# Patient Record
Sex: Male | Born: 1990 | Race: Black or African American | Hispanic: No | Marital: Married | Smoking: Current every day smoker
Health system: Southern US, Community
[De-identification: ages and names within clinical notes are randomized; demographics above are authoritative.]

## PROBLEM LIST (undated history)

## (undated) HISTORY — PX: OTHER SURGICAL HISTORY: SHX169

---
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
## 2012-01-21 ENCOUNTER — Encounter (HOSPITAL_COMMUNITY): Payer: Self-pay | Admitting: *Deleted

## 2012-01-21 ENCOUNTER — Emergency Department (HOSPITAL_COMMUNITY)
Admission: EM | Admit: 2012-01-21 | Discharge: 2012-01-21 | Disposition: A | Discharge status: Home / Self Care | Payer: BC Managed Care – PPO | Attending: Emergency Medicine | Admitting: Emergency Medicine

## 2012-01-21 DIAGNOSIS — S41009A Unspecified open wound of unspecified shoulder, initial encounter: Secondary | ICD-10-CM | POA: Insufficient documentation

## 2012-01-21 DIAGNOSIS — IMO0002 Reserved for concepts with insufficient information to code with codable children: Secondary | ICD-10-CM

## 2012-01-21 NOTE — ED Notes (Signed)
MD at bedside. 

## 2012-01-21 NOTE — ED Notes (Signed)
Patient denies pain and is resting comfortably.  
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
## 2012-04-12 ENCOUNTER — Encounter (HOSPITAL_COMMUNITY): Payer: Self-pay | Admitting: Emergency Medicine

## 2012-04-12 ENCOUNTER — Emergency Department (HOSPITAL_COMMUNITY)
Admission: EM | Admit: 2012-04-12 | Discharge: 2012-04-12 | Discharge status: Against Medical Advice | Payer: BC Managed Care – PPO | Attending: Emergency Medicine | Admitting: Emergency Medicine

## 2012-04-12 DIAGNOSIS — Z4802 Encounter for removal of sutures: Secondary | ICD-10-CM | POA: Insufficient documentation

## 2012-04-12 DIAGNOSIS — F17200 Nicotine dependence, unspecified, uncomplicated: Secondary | ICD-10-CM | POA: Insufficient documentation

## 2012-04-12 NOTE — ED Notes (Signed)
Called to room x 3, no answer 

## 2012-04-12 NOTE — ED Notes (Signed)
Pt here to have staples removed from left arm that has had in for 3 months

## 2017-03-08 ENCOUNTER — Emergency Department (HOSPITAL_COMMUNITY): Payer: No Typology Code available for payment source

## 2017-03-08 ENCOUNTER — Emergency Department (HOSPITAL_COMMUNITY)
Admission: EM | Admit: 2017-03-08 | Discharge: 2017-03-08 | Disposition: A | Discharge status: Home / Self Care | Payer: No Typology Code available for payment source | Attending: Emergency Medicine | Admitting: Emergency Medicine

## 2017-03-08 ENCOUNTER — Encounter (HOSPITAL_COMMUNITY): Payer: Self-pay

## 2017-03-08 DIAGNOSIS — Y92410 Unspecified street and highway as the place of occurrence of the external cause: Secondary | ICD-10-CM | POA: Diagnosis not present

## 2017-03-08 DIAGNOSIS — M25511 Pain in right shoulder: Secondary | ICD-10-CM | POA: Diagnosis present

## 2017-03-08 DIAGNOSIS — F17200 Nicotine dependence, unspecified, uncomplicated: Secondary | ICD-10-CM | POA: Diagnosis not present

## 2017-03-08 DIAGNOSIS — Y999 Unspecified external cause status: Secondary | ICD-10-CM | POA: Diagnosis not present

## 2017-03-08 DIAGNOSIS — Y9389 Activity, other specified: Secondary | ICD-10-CM | POA: Diagnosis not present

## 2017-03-08 MED ORDER — HYDROCODONE-ACETAMINOPHEN 5-325 MG PO TABS
1.0000 | ORAL_TABLET | Freq: Once | ORAL | Status: AC
  Administered 2017-03-08: 1 via ORAL
  Filled 2017-03-08: qty 1

## 2017-03-08 MED ORDER — DICLOFENAC SODIUM 50 MG PO TBEC: 50.0000 mg | DELAYED_RELEASE_TABLET | Freq: Two times a day (BID) | ORAL | 0 refills | Status: DC

## 2017-03-08 MED ORDER — CYCLOBENZAPRINE HCL 10 MG PO TABS
10.0000 mg | ORAL_TABLET | Freq: Once | ORAL | Status: AC
  Administered 2017-03-08: 10 mg via ORAL
  Filled 2017-03-08: qty 1

## 2017-03-08 MED ORDER — CYCLOBENZAPRINE HCL 10 MG PO TABS: 10.0000 mg | ORAL_TABLET | Freq: Two times a day (BID) | ORAL | 0 refills | Status: DC | PRN
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
## 2017-03-08 NOTE — Discharge Instructions (Signed)
Follow up with the orthopedic doctor. Return here as needed.  °

## 2017-11-17 ENCOUNTER — Encounter (HOSPITAL_COMMUNITY): Payer: Self-pay | Admitting: Family Medicine

## 2017-11-17 ENCOUNTER — Ambulatory Visit (HOSPITAL_COMMUNITY)
Admission: EM | Admit: 2017-11-17 | Discharge: 2017-11-17 | Disposition: A | Discharge status: Home / Self Care | Payer: Self-pay | Attending: Family Medicine | Admitting: Family Medicine

## 2017-11-17 DIAGNOSIS — R21 Rash and other nonspecific skin eruption: Secondary | ICD-10-CM

## 2017-11-17 MED ORDER — TRIAMCINOLONE ACETONIDE 0.1 % EX CREA: 1.0000 "application " | TOPICAL_CREAM | Freq: Two times a day (BID) | CUTANEOUS | 0 refills | Status: DC

## 2017-11-17 MED ORDER — TRIAMCINOLONE ACETONIDE 0.5 % EX OINT: 1.0000 "application " | TOPICAL_OINTMENT | Freq: Two times a day (BID) | CUTANEOUS | 0 refills | Status: DC

## 2017-11-17 NOTE — ED Triage Notes (Signed)
Pt here for rash to the right ankle. He does have a monitor to that ankle that he has had on since May 24th. He recently had poison ivy and was using calamine lotion but the rash has gotten worse and forming blisters.

## 2017-11-17 NOTE — ED Provider Notes (Signed)
[facility]    CSN: [id number] Arrival date & time: 11/17/17  1247     History   Chief Complaint Chief Complaint  Patient presents with  . Rash    HPI Bruce Davies is a 27 y.o. male no significant past medical history presenting today for evaluation of a rash.  Patient states that he has had eruptions 2 days bilateral lower ankles and feet for the past 3 to 4 days.  Is also noted worsening dryness of his feet which she denies having previous issues with.  He had an ankle monitor placed on his right ankle on May 24.  States that symptoms have all began since then.  Has never had issues like this previously.  Denies IV drug use.  Is having some itching, denies pain except for when monitor hitting areas.  Is noting to have some fluid-filled bubbles.  HPI  History reviewed. No pertinent past medical history.  There are no active problems to display for this patient.   Past Surgical History:  Procedure Laterality Date  . arm surgery         Home Medications    Prior to Admission medications   Medication Sig Start Date End Date Taking? Authorizing Provider  acetaminophen (TYLENOL) 500 MG tablet Take 500 mg by mouth every 6 (six) hours as needed for headache.    [provider]  triamcinolone ointment (KENALOG) 0.5 % Apply 1 application topically 2 (two) times daily. 11/17/17   Wilhelmena Zea, Junius CreamerHallie C, PA-C    Family History History reviewed. No pertinent family history.  Social History Social History   Tobacco Use  . Smoking status: Current Every Day Smoker  . Smokeless tobacco: Never Used  Substance Use Topics  . Alcohol use: Yes  . Drug use: Yes    Types: Marijuana     Allergies   Patient has no known allergies.   Review of Systems Review of Systems  Constitutional: Negative for fatigue and fever.  Eyes: Negative for redness, itching and visual disturbance.  Respiratory: Negative for shortness of breath.   Cardiovascular: Negative for chest  pain and leg swelling.  Gastrointestinal: Negative for nausea and vomiting.  Musculoskeletal: Negative for arthralgias and myalgias.  Skin: Positive for color change and rash. Negative for wound.  Neurological: Negative for dizziness, syncope, weakness, light-headedness and headaches.     Physical Exam Triage Vital Signs ED Triage Vitals  Enc Vitals Group     BP 11/17/17 1310 134/71     Pulse Rate 11/17/17 1310 72     Resp 11/17/17 1310 18     Temp 11/17/17 1310 98.6 F (37 C)     Temp src --      SpO2 11/17/17 1310 100 %     Weight --      Height --      Head Circumference --      Peak Flow --      Pain Score 11/17/17 1309 0     Pain Loc --      Pain Edu? --      Excl. in GC? --    No data found.  Updated Vital Signs BP 134/71   Pulse 72   Temp 98.6 F (37 C)   Resp 18   SpO2 100%   Visual Acuity Right Eye Distance:   Left Eye Distance:   Bilateral Distance:    Right Eye Near:   Left Eye Near:    Bilateral Near:  Physical Exam  Constitutional: He appears well-developed and well-nourished.  HENT:  Head: Normocephalic and atraumatic.  Eyes: Conjunctivae are normal.  Neck: Neck supple.  Cardiovascular: Normal rate and regular rhythm.  No murmur heard. Pulmonary/Chest: Effort normal and breath sounds normal. No respiratory distress.  Musculoskeletal: He exhibits no edema.  Neurological: He is alert.  Skin: Skin is warm and dry.  Multiple dry hyperpigmented circular lesions to bilateral ankles, right ankle with multiple areas of fluid-filled bulla, as well as exposed erythematous areas from popped bulla.  Bilateral plantar surfaces of feet with significant dryness.  Psychiatric: He has a normal mood and affect.  Nursing note and vitals reviewed.      UC Treatments / Results  Labs (all labs ordered are listed, but only abnormal results are displayed) Labs Reviewed - No data to display  EKG None  Radiology No results  found.  Procedures Procedures (including critical care time)  Medications Ordered in UC Medications - No data to display  Initial Impression / Assessment and Plan / UC Course  I have reviewed the triage vital signs and the nursing notes.  Pertinent labs & imaging results that were available during my care of the patient were reviewed by me and considered in my medical decision making (see chart for details).     Rash seems likely inflammatory be related to recent ankle monitor.  Will prescribe triamcinolone ointment to apply and use sock to protect skin from monitor.  Discussed using thicker creams/ointments on feet versus lotions.Discussed strict return precautions. Patient verbalized understanding and is agreeable with plan.  Final Clinical Impressions(s) / UC Diagnoses   Final diagnoses:  Rash and nonspecific skin eruption     Discharge Instructions     Please apply triamcinolone ointment to feet ankles and put a sock over at night, please wear a thin sock that will go under monitor to protect skin from monitor.    ED Prescriptions    Medication Sig Dispense Auth. Provider   triamcinolone cream (KENALOG) 0.1 %  (Status: Discontinued) Apply 1 application topically 2 (two) times daily. 30 g Alexio Sroka C, PA-C   triamcinolone ointment (KENALOG) 0.5 % Apply 1 application topically 2 (two) times daily. 30 g Merle Cirelli, [place] C, PA-C     Controlled Substance Prescriptions [place] Controlled Substance Registry consulted? Not Applicable   Lew Dawes, [place] 11/17/17 1441

## 2017-11-17 NOTE — Discharge Instructions (Signed)
Please apply triamcinolone ointment to feet ankles and put a sock over at night, please wear a thin sock that will go under monitor to protect skin from monitor.

## 2018-05-01 ENCOUNTER — Other Ambulatory Visit: Payer: Self-pay

## 2018-05-01 ENCOUNTER — Encounter (HOSPITAL_COMMUNITY): Payer: Self-pay | Admitting: Emergency Medicine

## 2018-05-01 ENCOUNTER — Ambulatory Visit (HOSPITAL_COMMUNITY)
Admission: EM | Admit: 2018-05-01 | Discharge: 2018-05-01 | Disposition: A | Discharge status: Home / Self Care | Payer: Self-pay | Attending: Family Medicine | Admitting: Family Medicine

## 2018-05-01 DIAGNOSIS — Z79899 Other long term (current) drug therapy: Secondary | ICD-10-CM | POA: Insufficient documentation

## 2018-05-01 DIAGNOSIS — Z833 Family history of diabetes mellitus: Secondary | ICD-10-CM | POA: Insufficient documentation

## 2018-05-01 DIAGNOSIS — Z791 Long term (current) use of non-steroidal anti-inflammatories (NSAID): Secondary | ICD-10-CM | POA: Insufficient documentation

## 2018-05-01 DIAGNOSIS — M79604 Pain in right leg: Secondary | ICD-10-CM

## 2018-05-01 DIAGNOSIS — Z202 Contact with and (suspected) exposure to infections with a predominantly sexual mode of transmission: Secondary | ICD-10-CM

## 2018-05-01 DIAGNOSIS — F17200 Nicotine dependence, unspecified, uncomplicated: Secondary | ICD-10-CM | POA: Insufficient documentation

## 2018-05-01 MED ORDER — AZITHROMYCIN 250 MG PO TABS
ORAL_TABLET | ORAL | Status: AC
  Filled 2018-05-01: qty 4

## 2018-05-01 MED ORDER — NAPROXEN 500 MG PO TABS: 500.0000 mg | ORAL_TABLET | Freq: Two times a day (BID) | ORAL | 0 refills | Status: DC

## 2018-05-01 MED ORDER — AZITHROMYCIN 250 MG PO TABS
1000.0000 mg | ORAL_TABLET | Freq: Once | ORAL | Status: AC
  Administered 2018-05-01: 1000 mg via ORAL

## 2018-05-01 NOTE — Discharge Instructions (Signed)
Please wear Ace wrap to help with compression and swelling Please take Naprosyn twice daily to help with swelling and inflammation Ice and elevate leg when at home and resting  We have treated you today for chlamydia, with azithromycin. Please refrain from sexual activity for 7 days while medicine is clearing infection.  We are testing you for Gonorrhea, Chlamydia and Trichomonas. We will call you if anything is positive and let you know if you require any further treatment. Please inform partner of any positive results.  Please return if symptoms not improving with treatment, development of fever, nausea, vomiting, abdominal pain, scrotal pain.
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
## 2018-05-02 ENCOUNTER — Ambulatory Visit (INDEPENDENT_AMBULATORY_CARE_PROVIDER_SITE_OTHER): Payer: Self-pay

## 2018-05-02 ENCOUNTER — Encounter (HOSPITAL_COMMUNITY): Payer: Self-pay

## 2018-05-02 ENCOUNTER — Ambulatory Visit (HOSPITAL_COMMUNITY)
Admission: EM | Admit: 2018-05-02 | Discharge: 2018-05-02 | Disposition: A | Discharge status: Home / Self Care | Payer: Self-pay | Attending: Family Medicine | Admitting: Family Medicine

## 2018-05-02 DIAGNOSIS — M25561 Pain in right knee: Secondary | ICD-10-CM

## 2018-05-02 DIAGNOSIS — S8001XA Contusion of right knee, initial encounter: Secondary | ICD-10-CM

## 2018-05-02 LAB — URINE CYTOLOGY ANCILLARY ONLY
Chlamydia: NEGATIVE
Neisseria Gonorrhea: NEGATIVE
Trichomonas: NEGATIVE

## 2018-05-02 MED ORDER — TRAMADOL HCL 50 MG PO TABS: 50.0000 mg | ORAL_TABLET | Freq: Four times a day (QID) | ORAL | 0 refills | Status: DC | PRN

## 2018-05-02 MED ORDER — DICLOFENAC SODIUM 75 MG PO TBEC: 75.0000 mg | DELAYED_RELEASE_TABLET | Freq: Two times a day (BID) | ORAL | 0 refills | Status: DC
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
## 2018-05-02 NOTE — ED Triage Notes (Signed)
Pt present that his right knee is very painful, Per  Pt is not able to bend his knee or do squats.

## 2019-01-16 ENCOUNTER — Encounter (HOSPITAL_COMMUNITY): Payer: Self-pay

## 2019-01-16 ENCOUNTER — Other Ambulatory Visit: Payer: Self-pay

## 2019-01-16 ENCOUNTER — Ambulatory Visit (HOSPITAL_COMMUNITY)
Admission: EM | Admit: 2019-01-16 | Discharge: 2019-01-16 | Disposition: A | Discharge status: Home / Self Care | Payer: Self-pay | Attending: Emergency Medicine | Admitting: Emergency Medicine

## 2019-01-16 DIAGNOSIS — G43009 Migraine without aura, not intractable, without status migrainosus: Secondary | ICD-10-CM

## 2019-01-16 MED ORDER — IBUPROFEN 800 MG PO TABS: 800.0000 mg | ORAL_TABLET | Freq: Three times a day (TID) | ORAL | 0 refills | Status: DC

## 2019-01-16 NOTE — ED Triage Notes (Signed)
Pt states he has a headache this started last night.

## 2019-01-16 NOTE — ED Provider Notes (Signed)
[facility]    CSN: [id number] Arrival date & time: 01/16/19  [id number]     History   Chief Complaint Chief Complaint  Patient presents with  . Headache    HPI Bruce Davies is a 28 y.o. male no significant past medical history presenting today for evaluation of a headache.  Patient states that last night he developed a headache on the left side of his head.  States that it wrapped around from the frontal to the occipital region.  States that it was pulsating in nature.  Is very intermittent and lasts approximately 15 minutes and waxes and wanes.  He is felt that intensify approximately 4 times since.  Does not have a history of headaches.  Denies progressive worsening.  Denies associated changes in vision.  Denies associated photophobia or phonophobia.  Denies nausea or vomiting.  He has not taken any medicines for his headache.  At time of visit he endorses a mild discomfort.  Denies symptoms on the right.  Denies difficulty speaking or confusion.  He does note that he works for [employer] is often out in the sun.  Has a few bottles of water daily, but does state he also drinks 4-5 sodas.  Denies associated chest pain or shortness of breath.  Denies URI symptoms of congestion cough or sore throat.  Denies fevers.  Denies neck stiffness.  HPI  History reviewed. No pertinent past medical history.  There are no active problems to display for this patient.   Past Surgical History:  Procedure Laterality Date  . arm surgery         Home Medications    Prior to Admission medications   Medication Sig Start Date End Date Taking? Authorizing Provider  acetaminophen (TYLENOL) 500 MG tablet Take 500 mg by mouth every 6 (six) hours as needed for headache.    [provider]  ibuprofen (ADVIL) 800 MG tablet Take 1 tablet (800 mg total) by mouth 3 (three) times daily. 01/16/19   Jock Mahon C, PA-C  traMADol (ULTRAM) 50 MG tablet Take 1 tablet (50 mg total) by mouth  every 6 (six) hours as needed. 05/02/18   Mardella LaymanHagler, Brian, MD    Family History Family History  Problem Relation Age of Onset  . Diabetes Father     Social History Social History   Tobacco Use  . Smoking status: Current Every Day Smoker  . Smokeless tobacco: Never Used  Substance Use Topics  . Alcohol use: Yes  . Drug use: Yes    Types: Marijuana     Allergies   Patient has no known allergies.   Review of Systems Review of Systems  Constitutional: Negative for fatigue and fever.  HENT: Negative for congestion, sinus pressure and sore throat.   Eyes: Negative for photophobia, pain and visual disturbance.  Respiratory: Negative for cough and shortness of breath.   Cardiovascular: Negative for chest pain.  Gastrointestinal: Negative for abdominal pain, nausea and vomiting.  Genitourinary: Negative for decreased urine volume and hematuria.  Musculoskeletal: Negative for myalgias, neck pain and neck stiffness.  Neurological: Positive for headaches. Negative for dizziness, syncope, facial asymmetry, speech difficulty, weakness, light-headedness and numbness.     Physical Exam Triage Vital Signs ED Triage Vitals  Enc Vitals Group     BP      Pulse      Resp      Temp      Temp src      SpO2  Weight      Height      Head Circumference      Peak Flow      Pain Score      Pain Loc      Pain Edu?      Excl. in GC?    No data found.  Updated Vital Signs BP (!) 146/82 (BP Location: Right Arm)   Pulse 65   Temp 98.3 F (36.8 C)   Resp 18   Wt 290 lb (131.5 kg)   SpO2 99%   BMI 39.33 kg/m   Visual Acuity Right Eye Distance:   Left Eye Distance:   Bilateral Distance:    Right Eye Near:   Left Eye Near:    Bilateral Near:     Physical Exam Vitals signs and nursing note reviewed.  Constitutional:      Appearance: He is well-developed.  HENT:     Head: Normocephalic and atraumatic.     Ears:     Comments: Bilateral ears without tenderness to  palpation of external auricle, tragus and mastoid, EAC's without erythema or swelling, TM's with good bony landmarks and cone of light. Non erythematous.     Mouth/Throat:     Comments: Oral mucosa pink and moist, no tonsillar enlargement or exudate. Posterior pharynx patent and nonerythematous, no uvula deviation or swelling. Normal phonation. Palate elevates symmetrically Eyes:     Extraocular Movements: Extraocular movements intact.     Conjunctiva/sclera: Conjunctivae normal.     Pupils: Pupils are equal, round, and reactive to light.  Neck:     Musculoskeletal: Neck supple.  Cardiovascular:     Rate and Rhythm: Normal rate and regular rhythm.     Heart sounds: No murmur.  Pulmonary:     Effort: Pulmonary effort is normal. No respiratory distress.     Breath sounds: Normal breath sounds.     Comments: Breathing comfortably at rest, CTABL, no wheezing, rales or other adventitious sounds auscultated Abdominal:     Palpations: Abdomen is soft.     Tenderness: There is no abdominal tenderness.  Skin:    General: Skin is warm and dry.  Neurological:     General: No focal deficit present.     Mental Status: He is alert and oriented to person, place, and time.     Comments: Patient A&O x3, cranial nerves II-XII grossly intact, strength at shoulders, hips and knees 5/5, equal bilaterally, patellar reflex 2+ bilaterally. Gait without abnormality.       UC Treatments / Results  Labs (all labs ordered are listed, but only abnormal results are displayed) Labs Reviewed - No data to display  EKG   Radiology No results found.  Procedures Procedures (including critical care time)  Medications Ordered in UC Medications - No data to display  Initial Impression / Assessment and Plan / UC Course  I have reviewed the triage vital signs and the nursing notes.  Pertinent labs & imaging results that were available during my care of the patient were reviewed by me and considered in my  medical decision making (see chart for details).     No red flags for headache, VSS, has not tried any oral medicines.  Headache waxing and waning.  Will recommend Tylenol and ibuprofen.  Hydration measures. Discussed strict return precautions. Patient verbalized understanding and is agreeable with plan.  Final Clinical Impressions(s) / UC Diagnoses   Final diagnoses:  Migraine without aura and without status migrainosus, not intractable  Discharge Instructions     Use anti-inflammatories for pain/swelling. You may take up to 800 mg Ibuprofen every 8 hours with food. You may supplement Ibuprofen with Tylenol [phone] mg every 8 hours.   Drink plenty of fluids- try drinking 1 gallon/day  Follow up if headache not resolving please follow up    ED Prescriptions    Medication Sig Dispense Auth. Provider   ibuprofen (ADVIL) 800 MG tablet Take 1 tablet (800 mg total) by mouth 3 (three) times daily. 21 tablet Dreon Pineda, [place] C, PA-C     Controlled Substance Prescriptions [place] Controlled Substance Registry consulted? Not Applicable   Janith Lima, [place] 01/16/19 1155

## 2019-01-16 NOTE — Discharge Instructions (Signed)
Use anti-inflammatories for pain/swelling. You may take up to 800 mg Ibuprofen every 8 hours with food. You may supplement Ibuprofen with Tylenol [phone] mg every 8 hours.   Drink plenty of fluids- try drinking 1 gallon/day  Follow up if headache not resolving please follow up

## 2019-02-28 ENCOUNTER — Other Ambulatory Visit: Payer: Self-pay

## 2019-02-28 DIAGNOSIS — Z20822 Contact with and (suspected) exposure to covid-19: Secondary | ICD-10-CM

## 2019-03-01 LAB — NOVEL CORONAVIRUS, NAA: SARS-CoV-2, NAA: NOT DETECTED

## 2019-04-04 ENCOUNTER — Encounter (HOSPITAL_COMMUNITY): Payer: Self-pay | Admitting: Emergency Medicine

## 2019-04-04 ENCOUNTER — Other Ambulatory Visit: Payer: Self-pay

## 2019-04-04 ENCOUNTER — Emergency Department (HOSPITAL_COMMUNITY)
Admission: EM | Admit: 2019-04-04 | Discharge: 2019-04-04 | Disposition: A | Discharge status: Home / Self Care | Payer: 59 | Attending: Emergency Medicine | Admitting: Emergency Medicine

## 2019-04-04 DIAGNOSIS — M79604 Pain in right leg: Secondary | ICD-10-CM | POA: Insufficient documentation

## 2019-04-04 DIAGNOSIS — Z5321 Procedure and treatment not carried out due to patient leaving prior to being seen by health care provider: Secondary | ICD-10-CM | POA: Diagnosis not present

## 2019-04-04 NOTE — ED Notes (Signed)
Called for room placement x2 without answer. 

## 2019-04-04 NOTE — ED Notes (Signed)
Patient called for room placement x1 with no answer. 
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
## 2019-12-19 ENCOUNTER — Encounter (HOSPITAL_COMMUNITY): Payer: Self-pay | Admitting: Emergency Medicine

## 2019-12-19 ENCOUNTER — Other Ambulatory Visit: Payer: Self-pay

## 2019-12-19 ENCOUNTER — Ambulatory Visit (HOSPITAL_COMMUNITY)
Admission: EM | Admit: 2019-12-19 | Discharge: 2019-12-19 | Disposition: A | Discharge status: Home / Self Care | Payer: Self-pay | Attending: Internal Medicine | Admitting: Internal Medicine

## 2019-12-19 DIAGNOSIS — Z113 Encounter for screening for infections with a predominantly sexual mode of transmission: Secondary | ICD-10-CM | POA: Insufficient documentation

## 2019-12-19 DIAGNOSIS — G44209 Tension-type headache, unspecified, not intractable: Secondary | ICD-10-CM

## 2019-12-19 MED ORDER — IBUPROFEN 600 MG PO TABS: 600.0000 mg | ORAL_TABLET | Freq: Four times a day (QID) | ORAL | 0 refills | Status: DC | PRN

## 2019-12-19 NOTE — ED Provider Notes (Signed)
[facility]    CSN: [id number] Arrival date & time: 12/19/19  0957      History   Chief Complaint Chief Complaint  Patient presents with  . Headache    HPI Bruce Davies is a 29 y.o. male comes to urgent care with a 1 day history of headache.  Patient says the headache involves the top of the head into the frontal aspect of the head.  No trauma to the head.  No nausea or vomiting.  No sick contacts.  No diarrhea.  Patient has not been vaccinated against COVID-19 virus.  Patient denies any light sensitivity.  He admits to having some increased fatigue.  Appetite is decreased.  Patient wants to be screened for STDs.   Marland Kitchen   HPI  History reviewed. No pertinent past medical history.  There are no problems to display for this patient.   Past Surgical History:  Procedure Laterality Date  . arm surgery         Home Medications    Prior to Admission medications   Medication Sig Start Date End Date Taking? Authorizing Provider  ibuprofen (ADVIL) 600 MG tablet Take 1 tablet (600 mg total) by mouth every 6 (six) hours as needed. 12/19/19   LampteyBritta Mccreedy, MD    Family History Family History  Problem Relation Age of Onset  . Diabetes Father     Social History Social History   Tobacco Use  . Smoking status: Current Every Day Smoker  . Smokeless tobacco: Never Used  Substance Use Topics  . Alcohol use: Yes  . Drug use: Yes    Types: Marijuana     Allergies   Patient has no known allergies.   Review of Systems Review of Systems  Constitutional: Positive for activity change and fatigue. Negative for chills and fever.  Respiratory: Negative.   Cardiovascular: Negative.   Gastrointestinal: Positive for nausea. Negative for abdominal pain and vomiting.  Genitourinary: Negative.   Musculoskeletal: Positive for arthralgias. Negative for myalgias.  Neurological: Positive for headaches. Negative for dizziness and light-headedness.      Physical Exam Triage Vital Signs ED Triage Vitals  Enc Vitals Group     BP 12/19/19 1053 116/71     Pulse Rate 12/19/19 1053 79     Resp 12/19/19 1053 20     Temp 12/19/19 1053 99.5 F (37.5 C)     Temp Source 12/19/19 1053 Oral     SpO2 12/19/19 1053 99 %     Weight 12/19/19 1047 270 lb 9.6 oz (122.7 kg)     Height --      Head Circumference --      Peak Flow --      Pain Score 12/19/19 1049 6     Pain Loc --      Pain Edu? --      Excl. in GC? --    No data found.  Updated Vital Signs BP 116/71 (BP Location: Left Arm) Comment (BP Location): large cuff  Pulse 79   Temp 99.5 F (37.5 C) (Oral)   Resp 20   Wt 122.7 kg   SpO2 99%   BMI 36.70 kg/m   Visual Acuity Right Eye Distance:   Left Eye Distance:   Bilateral Distance:    Right Eye Near:   Left Eye Near:    Bilateral Near:     Physical Exam Vitals and nursing note reviewed.  Constitutional:      General: He is  not in acute distress.    Appearance: He is not ill-appearing.  Cardiovascular:     Heart sounds: Normal heart sounds.  Pulmonary:     Effort: Pulmonary effort is normal.     Breath sounds: Normal breath sounds.  Abdominal:     General: Bowel sounds are normal.     Palpations: Abdomen is soft.  Musculoskeletal:        General: Normal range of motion.     Cervical back: Normal range of motion and neck supple.  Neurological:     Mental Status: He is alert.     GCS: GCS eye subscore is 4. GCS verbal subscore is 5. GCS motor subscore is 6.      UC Treatments / Results  Labs (all labs ordered are listed, but only abnormal results are displayed) Labs Reviewed  CYTOLOGY, (ORAL, ANAL, URETHRAL) ANCILLARY ONLY    EKG   Radiology No results found.  Procedures Procedures (including critical care time)  Medications Ordered in UC Medications - No data to display  Initial Impression / Assessment and Plan / UC Course  I have reviewed the triage vital signs and the nursing  notes.  Pertinent labs & imaging results that were available during my care of the patient were reviewed by me and considered in my medical decision making (see chart for details).     1.  Tension type headache: NSAIDs as needed for headache If symptoms persist please return to the urgent care If patient develops any persistent nausea, vomiting, confusion or worsening headache-patient is advised to report to the urgent care to be reevaluated. Patient declined COVID-19 testing.  2.  STD screening: GC/chlamydia/trichomonas Patient declined HIV/RPR evaluation Final Clinical Impressions(s) / UC Diagnoses   Final diagnoses:  Tension-type headache, not intractable, unspecified chronicity pattern  Screen for STD (sexually transmitted disease)     Discharge Instructions     Patient is advised to take ibuprofen as prescribed, If headache worsens please return to the urgent care or go to the emergency department to be reevaluated.   ED Prescriptions    Medication Sig Dispense Auth. Provider   ibuprofen (ADVIL) 600 MG tablet Take 1 tablet (600 mg total) by mouth every 6 (six) hours as needed. 30 tablet Dollye Glasser, Britta Mccreedy, MD     PDMP not reviewed this encounter.   Merrilee Jansky, MD 12/19/19 1341

## 2019-12-19 NOTE — Discharge Instructions (Signed)
Patient is advised to take ibuprofen as prescribed, If headache worsens please return to the urgent care or go to the emergency department to be reevaluated.

## 2019-12-19 NOTE — ED Triage Notes (Signed)
Patient has asked where he can get tested for std's.  Denies concerns, but hasn't been checked recently

## 2019-12-19 NOTE — ED Triage Notes (Signed)
Patient reports headache since last night.  Complains that top of head is hurting.  Pain is also above left eye.   Denies runny nose, cough.   Patient has been sleeping, but pain present when he wakes up.  Patient says he makes it worse.  Poor appetite since last night

## 2019-12-20 LAB — CYTOLOGY, (ORAL, ANAL, URETHRAL) ANCILLARY ONLY
Chlamydia: NEGATIVE
Comment: NEGATIVE
Comment: NEGATIVE
Comment: NORMAL
Neisseria Gonorrhea: NEGATIVE
Trichomonas: NEGATIVE

## 2020-02-04 ENCOUNTER — Other Ambulatory Visit: Payer: Self-pay

## 2020-02-04 ENCOUNTER — Encounter (HOSPITAL_COMMUNITY): Payer: Self-pay

## 2020-02-04 ENCOUNTER — Ambulatory Visit (HOSPITAL_COMMUNITY)
Admission: EM | Admit: 2020-02-04 | Discharge: 2020-02-04 | Disposition: A | Discharge status: Home / Self Care | Payer: No Typology Code available for payment source | Attending: Internal Medicine | Admitting: Internal Medicine

## 2020-02-04 DIAGNOSIS — Z20822 Contact with and (suspected) exposure to covid-19: Secondary | ICD-10-CM | POA: Insufficient documentation

## 2020-02-04 LAB — SARS CORONAVIRUS 2 (TAT 6-24 HRS): SARS Coronavirus 2: NEGATIVE

## 2020-02-04 NOTE — ED Triage Notes (Addendum)
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 Pt is denying ALL sxs today.

## 2020-05-19 ENCOUNTER — Ambulatory Visit (HOSPITAL_COMMUNITY)
Admission: EM | Admit: 2020-05-19 | Discharge: 2020-05-19 | Disposition: A | Discharge status: Home / Self Care | Payer: No Typology Code available for payment source | Attending: Emergency Medicine | Admitting: Emergency Medicine

## 2020-05-19 ENCOUNTER — Encounter (HOSPITAL_COMMUNITY): Payer: Self-pay

## 2020-05-19 ENCOUNTER — Ambulatory Visit (HOSPITAL_COMMUNITY)
Admission: EM | Admit: 2020-05-19 | Discharge: 2020-05-19 | Disposition: A | Discharge status: Home / Self Care | Payer: No Typology Code available for payment source

## 2020-05-19 ENCOUNTER — Other Ambulatory Visit: Payer: Self-pay

## 2020-05-19 DIAGNOSIS — Z20822 Contact with and (suspected) exposure to covid-19: Secondary | ICD-10-CM

## 2020-05-19 DIAGNOSIS — U071 COVID-19: Secondary | ICD-10-CM | POA: Diagnosis not present

## 2020-05-19 LAB — SARS CORONAVIRUS 2 (TAT 6-24 HRS): SARS Coronavirus 2: POSITIVE — AB

## 2020-05-19 NOTE — ED Notes (Signed)
Patient Called with not response

## 2020-05-19 NOTE — ED Triage Notes (Signed)
Pt presents for covid testing. Pt is unvaccinated. Denies any symptoms.

## 2020-05-20 ENCOUNTER — Encounter (HOSPITAL_COMMUNITY): Payer: Self-pay

## 2020-07-07 ENCOUNTER — Encounter (HOSPITAL_COMMUNITY): Payer: Self-pay

## 2020-07-07 ENCOUNTER — Ambulatory Visit (HOSPITAL_COMMUNITY)
Admission: EM | Admit: 2020-07-07 | Discharge: 2020-07-07 | Disposition: A | Discharge status: Home / Self Care | Payer: No Typology Code available for payment source

## 2020-07-07 ENCOUNTER — Other Ambulatory Visit: Payer: Self-pay

## 2020-07-07 ENCOUNTER — Emergency Department (HOSPITAL_COMMUNITY)
Admission: EM | Admit: 2020-07-07 | Discharge: 2020-07-07 | Disposition: A | Discharge status: Home / Self Care | Payer: No Typology Code available for payment source | Attending: Emergency Medicine | Admitting: Emergency Medicine

## 2020-07-07 DIAGNOSIS — R5383 Other fatigue: Secondary | ICD-10-CM | POA: Diagnosis present

## 2020-07-07 DIAGNOSIS — Z20822 Contact with and (suspected) exposure to covid-19: Secondary | ICD-10-CM

## 2020-07-07 DIAGNOSIS — F17210 Nicotine dependence, cigarettes, uncomplicated: Secondary | ICD-10-CM | POA: Insufficient documentation

## 2020-07-07 LAB — SARS CORONAVIRUS 2 (TAT 6-24 HRS): SARS Coronavirus 2: NEGATIVE

## 2020-07-07 NOTE — Discharge Instructions (Addendum)
Please check the results of your COVID-19 test on MyChart.  Please return to the emergency department with new or worsening symptoms.  It was a pleasure to meet you. 

## 2020-07-07 NOTE — ED Triage Notes (Signed)
Pt arrived via walk in, states exposure to COVID yesterday, states he is feeling somewhat fatigued but believes it is from working yesterday. Denies any other sx.

## 2020-07-07 NOTE — ED Provider Notes (Signed)
[facility]-EMERGENCY DEPT Provider Note   CSN: [id number] Arrival date & time: 07/07/20  2671     History Chief Complaint  Patient presents with  . Covid Exposure    Bruce Davies is a 30 y.o. male.  HPI Patient is a 30 year old male who presents the emergency department due to a possible Covid exposure through work.  Reports some mild fatigue but otherwise is asymptomatic.  States that he was at work yesterday and was told that over the course of the day everyone in the workplace was exposed to another coworker with COVID-19.  He has not been vaccinated for COVID-19.  Denies any fevers, chills, cough, chest pain, shortness of breath, nausea, vomiting.    History reviewed. No pertinent past medical history.  There are no problems to display for this patient.   Past Surgical History:  Procedure Laterality Date  . arm surgery         Family History  Problem Relation Age of Onset  . Diabetes Father     Social History   Tobacco Use  . Smoking status: Current Every Day Smoker    Types: Cigarettes  . Smokeless tobacco: Never Used  Substance Use Topics  . Alcohol use: Yes  . Drug use: Yes    Types: Marijuana    Home Medications Prior to Admission medications   Medication Sig Start Date End Date Taking? Authorizing Provider  ibuprofen (ADVIL) 600 MG tablet Take 1 tablet (600 mg total) by mouth every 6 (six) hours as needed. 12/19/19   Lamptey, Britta Mccreedy, MD    Allergies    Patient has no known allergies.  Review of Systems   Review of Systems  All other systems reviewed and are negative. Ten systems reviewed and are negative for acute change, except as noted in the HPI.    Physical Exam Updated Vital Signs BP (!) 153/86 (BP Location: Right Arm)   Pulse 75   Temp 98.4 F (36.9 C) (Oral)   Resp 16   Ht 6' (1.829 m)   Wt 129.3 kg   SpO2 98%   BMI 38.65 kg/m   Physical Exam Vitals and nursing note reviewed.  Constitutional:       General: He is not in acute distress.    Appearance: Normal appearance. He is not ill-appearing, toxic-appearing or diaphoretic.  HENT:     Head: Normocephalic and atraumatic.     Right Ear: External ear normal.     Left Ear: External ear normal.     Nose: Nose normal.     Mouth/Throat:     Mouth: Mucous membranes are moist.     Pharynx: Oropharynx is clear. No oropharyngeal exudate or posterior oropharyngeal erythema.  Eyes:     Extraocular Movements: Extraocular movements intact.  Cardiovascular:     Rate and Rhythm: Normal rate and regular rhythm.     Pulses: Normal pulses.     Heart sounds: Normal heart sounds. No murmur heard. No friction rub. No gallop.   Pulmonary:     Effort: Pulmonary effort is normal. No respiratory distress.     Breath sounds: Normal breath sounds. No stridor. No wheezing, rhonchi or rales.  Abdominal:     General: Abdomen is flat.     Tenderness: There is no abdominal tenderness.  Musculoskeletal:        General: Normal range of motion.     Cervical back: Normal range of motion and neck supple. No tenderness.  Skin:  General: Skin is warm and dry.  Neurological:     General: No focal deficit present.     Mental Status: He is alert and oriented to person, place, and time.  Psychiatric:        Mood and Affect: Mood normal.        Behavior: Behavior normal.    ED Results / Procedures / Treatments   Labs (all labs ordered are listed, but only abnormal results are displayed) Labs Reviewed  SARS CORONAVIRUS 2 (TAT 6-24 HRS)    EKG None  Radiology No results found.  Procedures Procedures   Medications Ordered in ED Medications - No data to display  ED Course  I have reviewed the triage vital signs and the nursing notes.  Pertinent labs & imaging results that were available during my care of the patient were reviewed by me and considered in my medical decision making (see chart for details).    MDM Rules/Calculators/A&P                           Patient presents today due to possible Covid exposure.  Will obtain a 6 to 24-hour COVID-19 test.  Patient is going to check the results on MyChart.  Lungs are clear to auscultation bilaterally and heart is regular rate and rhythm.  Patient reports some mild fatigue at this time but otherwise has no complaints.  He has not been vaccinated for COVID-19.  Discussed how to check his results.  Return with new or worsening symptoms.  His questions were answered and he was amicable at the time of discharge.  Final Clinical Impression(s) / ED Diagnoses Final diagnoses:  Close exposure to COVID-19 virus    Rx / DC Orders ED Discharge Orders    None       Placido Sou, PA-C 07/07/20 1010    Arby Barrette, MD 07/09/20 [phone]

## 2021-02-15 ENCOUNTER — Encounter (HOSPITAL_BASED_OUTPATIENT_CLINIC_OR_DEPARTMENT_OTHER): Payer: Self-pay

## 2021-02-15 ENCOUNTER — Emergency Department (HOSPITAL_BASED_OUTPATIENT_CLINIC_OR_DEPARTMENT_OTHER)
Admission: EM | Admit: 2021-02-15 | Discharge: 2021-02-15 | Disposition: A | Discharge status: Home / Self Care | Payer: No Typology Code available for payment source | Attending: Emergency Medicine | Admitting: Emergency Medicine

## 2021-02-15 ENCOUNTER — Other Ambulatory Visit: Payer: Self-pay

## 2021-02-15 DIAGNOSIS — R509 Fever, unspecified: Secondary | ICD-10-CM | POA: Insufficient documentation

## 2021-02-15 DIAGNOSIS — R519 Headache, unspecified: Secondary | ICD-10-CM | POA: Insufficient documentation

## 2021-02-15 DIAGNOSIS — Z20822 Contact with and (suspected) exposure to covid-19: Secondary | ICD-10-CM | POA: Insufficient documentation

## 2021-02-15 DIAGNOSIS — M791 Myalgia, unspecified site: Secondary | ICD-10-CM | POA: Insufficient documentation

## 2021-02-15 DIAGNOSIS — F17210 Nicotine dependence, cigarettes, uncomplicated: Secondary | ICD-10-CM | POA: Insufficient documentation

## 2021-02-15 DIAGNOSIS — R5383 Other fatigue: Secondary | ICD-10-CM | POA: Insufficient documentation

## 2021-02-15 DIAGNOSIS — R110 Nausea: Secondary | ICD-10-CM

## 2021-02-15 DIAGNOSIS — R112 Nausea with vomiting, unspecified: Secondary | ICD-10-CM | POA: Insufficient documentation

## 2021-02-15 LAB — RESP PANEL BY RT-PCR (FLU A&B, COVID) ARPGX2
Influenza A by PCR: NEGATIVE
Influenza B by PCR: NEGATIVE
SARS Coronavirus 2 by RT PCR: NEGATIVE

## 2021-02-15 MED ORDER — ONDANSETRON HCL 4 MG PO TABS: 4.0000 mg | ORAL_TABLET | Freq: Three times a day (TID) | ORAL | 0 refills | Status: DC | PRN

## 2021-02-15 MED ORDER — ONDANSETRON 4 MG PO TBDP
8.0000 mg | ORAL_TABLET | Freq: Once | ORAL | Status: AC
  Administered 2021-02-15: 8 mg via ORAL
  Filled 2021-02-15: qty 2

## 2021-02-15 NOTE — ED Triage Notes (Signed)
Pt arrives POV, c/o fevers since Friday night.  Thinks he was exposed to Covid.  Reports temperatures 102 at home.  Has had some vomiting.  Has taken Tylenol.  Has not done Covid test.

## 2021-02-15 NOTE — ED Provider Notes (Signed)
[facility] EMERGENCY DEPT Provider Note   CSN: [id number] Arrival date & time: 02/15/21  8315     History Chief Complaint  Patient presents with   Fever    Bruce Davies is a 30 y.o. male.  HPI  30 year old male presents the emergency department with concern for generalized illness.  3 days ago he states that he developed fevers, generalized myalgias, fatigue and headache.  Starting yesterday he has had nausea with intermittent episodes of nonbloody emesis.  He denies any abdominal pain or diarrhea.  States T-max at home was 102 and has been responsive to Tylenol ibuprofen.  He believes someone at work recently had COVID.  He has received his vaccinations.  Denies any neck pain/stiffness, chest pain, cough.  History reviewed. No pertinent past medical history.  There are no problems to display for this patient.   Past Surgical History:  Procedure Laterality Date   arm surgery         Family History  Problem Relation Age of Onset   Diabetes Father     Social History   Tobacco Use   Smoking status: Every Day    Packs/day: 0.20    Types: Cigarettes   Smokeless tobacco: Never  Vaping Use   Vaping Use: Never used  Substance Use Topics   Alcohol use: Yes    Comment: occasionally   Drug use: Yes    Types: Marijuana    Comment: daily    Home Medications Prior to Admission medications   Medication Sig Start Date End Date Taking? Authorizing Provider  ibuprofen (ADVIL) 600 MG tablet Take 1 tablet (600 mg total) by mouth every 6 (six) hours as needed. 12/19/19   Lamptey, Britta Mccreedy, MD    Allergies    Patient has no known allergies.  Review of Systems   Review of Systems  Constitutional:  Positive for appetite change, chills and fever.  HENT:  Negative for congestion.   Eyes:  Negative for visual disturbance.  Respiratory:  Negative for shortness of breath.   Cardiovascular:  Negative for chest pain.  Gastrointestinal:  Positive for nausea  and vomiting. Negative for abdominal pain and diarrhea.  Genitourinary:  Negative for dysuria.  Musculoskeletal:  Positive for myalgias.  Skin:  Negative for rash.  Neurological:  Positive for headaches.   Physical Exam Updated Vital Signs Ht 6' (1.829 m)   Wt 124.7 kg   BMI 37.30 kg/m   Physical Exam Vitals and nursing note reviewed.  Constitutional:      General: He is not in acute distress.    Appearance: Normal appearance.  HENT:     Head: Normocephalic.     Mouth/Throat:     Mouth: Mucous membranes are moist.  Cardiovascular:     Rate and Rhythm: Normal rate.  Pulmonary:     Effort: Pulmonary effort is normal. No respiratory distress.  Abdominal:     Palpations: Abdomen is soft.     Tenderness: There is no abdominal tenderness. There is no guarding.  Musculoskeletal:     Cervical back: No rigidity or tenderness.  Skin:    General: Skin is warm.  Neurological:     Mental Status: He is alert and oriented to person, place, and time. Mental status is at baseline.  Psychiatric:        Mood and Affect: Mood normal.    ED Results / Procedures / Treatments   Labs (all labs ordered are listed, but only abnormal results are displayed) Labs  Reviewed  RESP PANEL BY RT-PCR (FLU A&B, COVID) ARPGX2    EKG None  Radiology No results found.  Procedures Procedures   Medications Ordered in ED Medications  ondansetron (ZOFRAN-ODT) disintegrating tablet 8 mg (has no administration in time range)    ED Course  I have reviewed the triage vital signs and the nursing notes.  Pertinent labs & imaging results that were available during my care of the patient were reviewed by me and considered in my medical decision making (see chart for details).    MDM Rules/Calculators/A&P                           30 year old male presents emergency department for evaluation of generalized illness including fever, chills, mild headache, nausea with an episode of vomiting.  Patient's  main complaint right now is nausea.  Vitals are stable on arrival.  Abdomen is nontender, no pain complaint, no diarrhea.  Flu and COVID swab are negative.  Low suspicion for acute abdominal pathology given benign exam and current presentation.  No indication for emergent labs/imaging at this time.  Patient is otherwise very well-appearing.  After dose of Zofran patient feels improved, is been able to p.o.  Suspect some other viral process.  Patient at this time appears safe and stable for discharge and will be treated as an outpatient.  Discharge plan and strict return to ED precautions discussed, patient verbalizes understanding and agreement.  Final Clinical Impression(s) / ED Diagnoses Final diagnoses:  None    Rx / DC Orders ED Discharge Orders     None        Rozelle Logan, DO 02/15/21 1117

## 2021-02-15 NOTE — ED Notes (Signed)
Patient verbalizes understanding of discharge instructions. Opportunity for questioning and answers were provided. Patient discharged from ED.  °

## 2021-02-15 NOTE — Discharge Instructions (Signed)
You have been seen and discharged from the emergency department.  Your flu and COVID swab are negative.  Take nausea medicine as needed.  Stay well-hydrated.  Follow-up with your primary provider for reevaluation and further care. Take home medications as prescribed. If you have any worsening symptoms or further concerns for your health please return to an emergency department for further evaluation.

## 2021-03-17 ENCOUNTER — Emergency Department (HOSPITAL_BASED_OUTPATIENT_CLINIC_OR_DEPARTMENT_OTHER)
Admission: EM | Admit: 2021-03-17 | Discharge: 2021-03-17 | Disposition: A | Discharge status: Home / Self Care | Payer: Self-pay | Attending: Emergency Medicine | Admitting: Emergency Medicine

## 2021-03-17 ENCOUNTER — Encounter (HOSPITAL_BASED_OUTPATIENT_CLINIC_OR_DEPARTMENT_OTHER): Payer: Self-pay | Admitting: Obstetrics and Gynecology

## 2021-03-17 ENCOUNTER — Other Ambulatory Visit: Payer: Self-pay

## 2021-03-17 DIAGNOSIS — U071 COVID-19: Secondary | ICD-10-CM | POA: Insufficient documentation

## 2021-03-17 DIAGNOSIS — Z20822 Contact with and (suspected) exposure to covid-19: Secondary | ICD-10-CM

## 2021-03-17 DIAGNOSIS — F17210 Nicotine dependence, cigarettes, uncomplicated: Secondary | ICD-10-CM | POA: Insufficient documentation

## 2021-03-17 LAB — RESP PANEL BY RT-PCR (FLU A&B, COVID) ARPGX2
Influenza A by PCR: NEGATIVE
Influenza B by PCR: NEGATIVE
SARS Coronavirus 2 by RT PCR: POSITIVE — AB

## 2021-03-17 NOTE — ED Triage Notes (Signed)
Patient reports fatigue and lack of appetite. Patient reports he was exposed yesterday to someone with COVID. Patient reports he is vaccinated against COVID

## 2021-03-17 NOTE — Discharge Instructions (Addendum)
Follow-up in your MyChart account for your COVID test results to report your employer. The CDC recommends not testing until day 10 when you are generally asymptomatic.

## 2021-03-17 NOTE — ED Provider Notes (Addendum)
[facility] EMERGENCY DEPT Provider Note   CSN: [id number] Arrival date & time: 03/17/21  1739     History Chief Complaint  Patient presents with   Covid Exposure    Bruce Davies is a 30 y.o. male.  30 year old male presents with complaint of fatigue after exposure to someone at work who has tested positive for COVID.  Patient's employer is requiring him to obtain a COVID test prior to returning for work.  Patient is vaccinated.  No history of asthma or chronic lung disease.  No other complaints or concerns.  Bruce Davies was evaluated in Emergency Department on 03/17/2021 for the symptoms described in the history of present illness. He was evaluated in the context of the global COVID-19 pandemic, which necessitated consideration that the patient might be at risk for infection with the SARS-CoV-2 virus that causes COVID-19. Institutional protocols and algorithms that pertain to the evaluation of patients at risk for COVID-19 are in a state of rapid change based on information released by regulatory bodies including the CDC and federal and state organizations. These policies and algorithms were followed during the patient's care in the ED.       History reviewed. No pertinent past medical history.  There are no problems to display for this patient.   Past Surgical History:  Procedure Laterality Date   arm surgery         Family History  Problem Relation Age of Onset   Diabetes Father     Social History   Tobacco Use   Smoking status: Every Day    Packs/day: 0.20    Types: Cigarettes   Smokeless tobacco: Never  Vaping Use   Vaping Use: Never used  Substance Use Topics   Alcohol use: Yes    Comment: occasionally   Drug use: Yes    Types: Marijuana    Comment: daily    Home Medications Prior to Admission medications   Medication Sig Start Date End Date Taking? Authorizing Provider  ibuprofen (ADVIL) 600 MG tablet Take 1 tablet (600  mg total) by mouth every 6 (six) hours as needed. 12/19/19   Lamptey, Britta Mccreedy, MD  ondansetron (ZOFRAN) 4 MG tablet Take 1 tablet (4 mg total) by mouth every 8 (eight) hours as needed for nausea or vomiting. 02/15/21   Horton, Clabe Seal, DO    Allergies    Patient has no known allergies.  Review of Systems   Review of Systems  Constitutional:  Positive for fatigue. Negative for fever.  HENT:  Negative for congestion.   Respiratory:  Negative for cough.   Musculoskeletal:  Negative for arthralgias and myalgias.  Skin:  Negative for rash and wound.  Allergic/Immunologic: Negative for immunocompromised state.  All other systems reviewed and are negative.  Physical Exam Updated Vital Signs BP (!) 163/80 (BP Location: Right Arm)   Pulse 77   Temp 98.4 F (36.9 C) (Oral)   Resp 18   SpO2 100%   Physical Exam Vitals and nursing note reviewed.  Constitutional:      General: He is not in acute distress.    Appearance: He is well-developed. He is not diaphoretic.  HENT:     Head: Normocephalic and atraumatic.     Right Ear: Tympanic membrane and ear canal normal.     Left Ear: Tympanic membrane and ear canal normal.     Nose: Nose normal.     Mouth/Throat:     Mouth: Mucous membranes are  moist.  Eyes:     Conjunctiva/sclera: Conjunctivae normal.  Cardiovascular:     Rate and Rhythm: Normal rate and regular rhythm.     Heart sounds: Normal heart sounds.  Pulmonary:     Effort: Pulmonary effort is normal.     Breath sounds: Normal breath sounds.  Musculoskeletal:     Cervical back: Neck supple.  Lymphadenopathy:     Cervical: No cervical adenopathy.  Skin:    General: Skin is warm and dry.     Findings: No erythema or rash.  Neurological:     Mental Status: He is alert and oriented to person, place, and time.  Psychiatric:        Behavior: Behavior normal.    ED Results / Procedures / Treatments   Labs (all labs ordered are listed, but only abnormal results are  displayed) Labs Reviewed  RESP PANEL BY RT-PCR (FLU A&B, COVID) ARPGX2 - Abnormal; Notable for the following components:      Result Value   SARS Coronavirus 2 by RT PCR POSITIVE (*)    All other components within normal limits    EKG None  Radiology No results found.  Procedures Procedures   Medications Ordered in ED Medications - No data to display  ED Course  I have reviewed the triage vital signs and the nursing notes.  Pertinent labs & imaging results that were available during my care of the patient were reviewed by me and considered in my medical decision making (see chart for details).  Clinical Course as of 03/17/21 [name]  Wed Mar 17, 2021  3090 30 year old male with complaint of fatigue.  Patient exposed to COVID yesterday at work, is required to obtain a COVID test prior to returning to work.  No other symptoms.  Patient is well-appearing, lungs clear to auscultation.  Exam unremarkable.  Vitals reviewed, blood pressure is slightly elevated at 163/80, O2 sat 100% on room air.  Patient is advised to recheck his results in MyChart and report to employer. [LM]  1855 Attempted to contact patient with his positive COVID test results however he did not answer and his voicemail box has not been set up. [LM]    Clinical Course User Index [LM] Alden Hipp   MDM Rules/Calculators/A&P                           Final Clinical Impression(s) / ED Diagnoses Final diagnoses:  Close exposure to COVID-19 virus  COVID    Rx / DC Orders ED Discharge Orders     None        Jeannie Fend, PA-C 03/17/21 1747    Virgina Norfolk, DO 03/17/21 1811    Jeannie Fend, PA-C 03/17/21 1855    Virgina Norfolk, DO 03/17/21 1945

## 2021-07-16 ENCOUNTER — Encounter (HOSPITAL_COMMUNITY): Payer: Self-pay

## 2021-07-16 ENCOUNTER — Ambulatory Visit (HOSPITAL_COMMUNITY)
Admission: EM | Admit: 2021-07-16 | Discharge: 2021-07-16 | Disposition: A | Discharge status: Home / Self Care | Payer: Self-pay | Attending: Student | Admitting: Student

## 2021-07-16 DIAGNOSIS — R110 Nausea: Secondary | ICD-10-CM | POA: Insufficient documentation

## 2021-07-16 DIAGNOSIS — Z20822 Contact with and (suspected) exposure to covid-19: Secondary | ICD-10-CM | POA: Insufficient documentation

## 2021-07-16 DIAGNOSIS — Z1152 Encounter for screening for COVID-19: Secondary | ICD-10-CM | POA: Insufficient documentation

## 2021-07-16 LAB — SARS CORONAVIRUS 2 (TAT 6-24 HRS): SARS Coronavirus 2: NEGATIVE

## 2021-07-16 NOTE — ED Triage Notes (Signed)
Pt presents with c/o headaches, nausea, diarrhea X 2 days.

## 2021-07-16 NOTE — Discharge Instructions (Signed)
-  Mucinex over-the-counter can help with congestion  -With a virus, you're typically contagious for 5-7 days, or as long as you're having fevers.

## 2021-07-16 NOTE — ED Provider Notes (Signed)
[place]    CSN: [id number] Arrival date & time: 07/16/21  0857      History   Chief Complaint Chief Complaint  Patient presents with   Fatigue   Nausea   Diarrhea   Headache    HPI Nichlas Moreland II is a 31 y.o. male presenting with fatigue, nasal congestion, nausea without vomiting, decreased sense of smell, diarrhea few times daily. Has attempted [facility] powder with some relief of headaches. Denies fevers/chills, SOB, CP, dizziness, weakness.   HPI  History reviewed. No pertinent past medical history.  There are no problems to display for this patient.   Past Surgical History:  Procedure Laterality Date   arm surgery         Home Medications    Prior to Admission medications   Medication Sig Start Date End Date Taking? Authorizing Provider  ibuprofen (ADVIL) 600 MG tablet Take 1 tablet (600 mg total) by mouth every 6 (six) hours as needed. 12/19/19   Chase Picket, MD  ondansetron (ZOFRAN) 4 MG tablet Take 1 tablet (4 mg total) by mouth every 8 (eight) hours as needed for nausea or vomiting. 02/15/21   Horton, Alvin Critchley, DO    Family History Family History  Problem Relation Age of Onset   Diabetes Father     Social History Social History   Tobacco Use   Smoking status: Every Day    Packs/day: 0.20    Types: Cigarettes   Smokeless tobacco: Never  Vaping Use   Vaping Use: Never used  Substance Use Topics   Alcohol use: Yes    Comment: occasionally   Drug use: Yes    Types: Marijuana    Comment: daily     Allergies   Patient has no known allergies.   Review of Systems Review of Systems  Constitutional:  Negative for appetite change, chills and fever.  HENT:  Positive for congestion. Negative for ear pain, rhinorrhea, sinus pressure, sinus pain and sore throat.   Eyes:  Negative for redness and visual disturbance.  Respiratory:  Negative for cough, chest tightness, shortness of breath and wheezing.   Cardiovascular:  Negative  for chest pain and palpitations.  Gastrointestinal:  Positive for diarrhea and nausea. Negative for abdominal pain, constipation and vomiting.  Genitourinary:  Negative for dysuria, frequency and urgency.  Musculoskeletal:  Negative for myalgias.  Neurological:  Negative for dizziness, weakness and headaches.  Psychiatric/Behavioral:  Negative for confusion.   All other systems reviewed and are negative.   Physical Exam Triage Vital Signs ED Triage Vitals  Enc Vitals Group     BP 07/16/21 0952 (!) 138/94     Pulse Rate 07/16/21 0952 64     Resp 07/16/21 0952 17     Temp 07/16/21 0952 97.9 F (36.6 C)     Temp Source 07/16/21 0952 Oral     SpO2 07/16/21 0952 98 %     Weight --      Height --      Head Circumference --      Peak Flow --      Pain Score 07/16/21 0951 0     Pain Loc --      Pain Edu? --      Excl. in [place]? --    No data found.  Updated Vital Signs BP (!) 138/94 (BP Location: Left Arm)    Pulse 64    Temp 97.9 F (36.6 C) (Oral)    Resp 17  SpO2 98%   Visual Acuity Right Eye Distance:   Left Eye Distance:   Bilateral Distance:    Right Eye Near:   Left Eye Near:    Bilateral Near:     Physical Exam Vitals reviewed.  Constitutional:      General: He is not in acute distress.    Appearance: Normal appearance. He is not ill-appearing.  HENT:     Head: Normocephalic and atraumatic.     Right Ear: Tympanic membrane, ear canal and external ear normal. No tenderness. No middle ear effusion. There is no impacted cerumen. Tympanic membrane is not perforated, erythematous, retracted or bulging.     Left Ear: Tympanic membrane, ear canal and external ear normal. No tenderness.  No middle ear effusion. There is no impacted cerumen. Tympanic membrane is not perforated, erythematous, retracted or bulging.     Nose: Nose normal. No congestion.     Mouth/Throat:     Mouth: Mucous membranes are moist.     Pharynx: Uvula midline. No oropharyngeal exudate or posterior  oropharyngeal erythema.  Eyes:     Extraocular Movements: Extraocular movements intact.     Pupils: Pupils are equal, round, and reactive to light.  Cardiovascular:     Rate and Rhythm: Normal rate and regular rhythm.     Heart sounds: Normal heart sounds.  Pulmonary:     Effort: Pulmonary effort is normal.     Breath sounds: Normal breath sounds. No decreased breath sounds, wheezing, rhonchi or rales.  Abdominal:     Palpations: Abdomen is soft.     Tenderness: There is no abdominal tenderness. There is no guarding or rebound.  Lymphadenopathy:     Cervical: No cervical adenopathy.     Right cervical: No superficial cervical adenopathy.    Left cervical: No superficial cervical adenopathy.  Neurological:     General: No focal deficit present.     Mental Status: He is alert and oriented to person, place, and time.  Psychiatric:        Mood and Affect: Mood normal.        Behavior: Behavior normal.        Thought Content: Thought content normal.        Judgment: Judgment normal.     UC Treatments / Results  Labs (all labs ordered are listed, but only abnormal results are displayed) Labs Reviewed  SARS CORONAVIRUS 2 (TAT 6-24 HRS)    EKG   Radiology No results found.  Procedures Procedures (including critical care time)  Medications Ordered in UC Medications - No data to display  Initial Impression / Assessment and Plan / UC Course  I have reviewed the triage vital signs and the nursing notes.  Pertinent labs & imaging results that were available during my care of the patient were reviewed by me and considered in my medical decision making (see chart for details).     This patient is a very pleasant 32 y.o. year old male presenting with viral syndrome x2 days. Today this pt is afebrile nontachycardic nontachypneic, oxygenating well on room air, no wheezes rhonchi or rales. No history pulm ds.  Vaccinated for covid. Covid PCR sent.   Prefers OTC medications for  symptomatic relief.   ED return precautions discussed. Patient verbalizes understanding and agreement.     Final Clinical Impressions(s) / UC Diagnoses   Final diagnoses:  Nausea without vomiting  Encounter for screening for COVID-19     Discharge Instructions      -  Mucinex over-the-counter can help with congestion  -With a virus, you're typically contagious for 5-7 days, or as long as you're having fevers.       ED Prescriptions   None    PDMP not reviewed this encounter.   Hazel Sams, PA-C 07/16/21 1016

## 2021-11-16 ENCOUNTER — Emergency Department (HOSPITAL_COMMUNITY): Payer: Self-pay

## 2021-11-16 ENCOUNTER — Emergency Department (HOSPITAL_COMMUNITY)
Admission: EM | Admit: 2021-11-16 | Discharge: 2021-11-16 | Disposition: A | Discharge status: Home / Self Care | Payer: Self-pay | Attending: Emergency Medicine | Admitting: Emergency Medicine

## 2021-11-16 ENCOUNTER — Encounter (HOSPITAL_COMMUNITY): Payer: Self-pay

## 2021-11-16 DIAGNOSIS — Y999 Unspecified external cause status: Secondary | ICD-10-CM | POA: Insufficient documentation

## 2021-11-16 DIAGNOSIS — M79641 Pain in right hand: Secondary | ICD-10-CM | POA: Insufficient documentation

## 2021-11-16 DIAGNOSIS — Y92410 Unspecified street and highway as the place of occurrence of the external cause: Secondary | ICD-10-CM | POA: Insufficient documentation

## 2021-11-16 DIAGNOSIS — Y9389 Activity, other specified: Secondary | ICD-10-CM | POA: Diagnosis not present

## 2021-11-16 MED ORDER — METHOCARBAMOL 500 MG PO TABS: 500.0000 mg | ORAL_TABLET | Freq: Two times a day (BID) | ORAL | 0 refills | Status: DC
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
## 2021-11-16 NOTE — Discharge Instructions (Addendum)
It was a pleasure taking care of you today!  Your imaging in the ED was negative for fracture or dislocations. You will feel more sore in the morning. You are prescribed Robaxin (muscle relaxer). Do not drive or operate heavy machinery while taking the muscle relaxer. Attached is information for the on-call orthopedist specialist, call and set up a follow up appointment regarding todays ED visit. You may take over the counter 600 mg Ibuprofen every 6 hours or 1,000 mg Tylenol every 6 hours as needed for pain for no more than 7 days. You may apply ice or heat to affected area for up to 15 minutes at a time. Ensure to place a barrier between your skin and the ice/heat. Return to the Emergency Department if you are experiencing increasing/worsening hand pain, color change, wound, or worsening symptoms.

## 2022-06-05 ENCOUNTER — Emergency Department (HOSPITAL_COMMUNITY): Payer: BC Managed Care – PPO

## 2022-06-05 ENCOUNTER — Encounter (HOSPITAL_COMMUNITY): Payer: Self-pay

## 2022-06-05 ENCOUNTER — Other Ambulatory Visit: Payer: Self-pay

## 2022-06-05 ENCOUNTER — Emergency Department (HOSPITAL_COMMUNITY)
Admission: EM | Admit: 2022-06-05 | Discharge: 2022-06-05 | Disposition: A | Discharge status: Home / Self Care | Payer: BC Managed Care – PPO | Attending: Emergency Medicine | Admitting: Emergency Medicine

## 2022-06-05 DIAGNOSIS — Z1152 Encounter for screening for COVID-19: Secondary | ICD-10-CM | POA: Diagnosis not present

## 2022-06-05 DIAGNOSIS — J189 Pneumonia, unspecified organism: Secondary | ICD-10-CM | POA: Diagnosis not present

## 2022-06-05 DIAGNOSIS — R0602 Shortness of breath: Secondary | ICD-10-CM | POA: Diagnosis present

## 2022-06-05 DIAGNOSIS — Z8616 Personal history of COVID-19: Secondary | ICD-10-CM | POA: Diagnosis not present

## 2022-06-05 LAB — RESP PANEL BY RT-PCR (RSV, FLU A&B, COVID)  RVPGX2
Influenza A by PCR: NEGATIVE
Influenza B by PCR: NEGATIVE
Resp Syncytial Virus by PCR: NEGATIVE
SARS Coronavirus 2 by RT PCR: NEGATIVE

## 2022-06-05 LAB — BASIC METABOLIC PANEL
Anion gap: 6 (ref 5–15)
BUN: 9 mg/dL (ref 6–20)
CO2: 27 mmol/L (ref 22–32)
Calcium: 8.9 mg/dL (ref 8.9–10.3)
Chloride: 107 mmol/L (ref 98–111)
Creatinine, Ser: 1.17 mg/dL (ref 0.61–1.24)
GFR, Estimated: >60 mL/min (ref >60)
Glucose, Bld: 94 mg/dL (ref 70–99)
Potassium: 4.2 mmol/L (ref 3.5–5.1)
Sodium: 140 mmol/L (ref 135–145)

## 2022-06-05 LAB — BRAIN NATRIURETIC PEPTIDE: B Natriuretic Peptide: 42.8 pg/mL (ref 0.0–100.0)

## 2022-06-05 LAB — CBC WITH DIFFERENTIAL/PLATELET
Abs Immature Granulocytes: 0.03 10*3/uL (ref 0.00–0.07)
Basophils Absolute: 0 10*3/uL (ref 0.0–0.1)
Basophils Relative: 0 %
Eosinophils Absolute: 0.2 10*3/uL (ref 0.0–0.5)
Eosinophils Relative: 2 %
HCT: 41 % (ref 39.0–52.0)
Hemoglobin: 13.5 g/dL (ref 13.0–17.0)
Immature Granulocytes: 0 %
Lymphocytes Relative: 38 %
Lymphs Abs: 3.2 10*3/uL (ref 0.7–4.0)
MCH: 27.7 pg (ref 26.0–34.0)
MCHC: 32.9 g/dL (ref 30.0–36.0)
MCV: 84.2 fL (ref 80.0–100.0)
Monocytes Absolute: 0.7 10*3/uL (ref 0.1–1.0)
Monocytes Relative: 9 %
Neutro Abs: 4.3 10*3/uL (ref 1.7–7.7)
Neutrophils Relative %: 51 %
Platelets: 270 10*3/uL (ref 150–400)
RBC: 4.87 MIL/uL (ref 4.22–5.81)
RDW: 13.5 % (ref 11.5–15.5)
WBC: 8.4 10*3/uL (ref 4.0–10.5)
nRBC: 0 % (ref 0.0–0.2)

## 2022-06-05 LAB — TROPONIN I (HIGH SENSITIVITY)
Troponin I (High Sensitivity): 4 ng/L (ref <18)
Troponin I (High Sensitivity): 4 ng/L (ref <18)

## 2022-06-05 LAB — D-DIMER, QUANTITATIVE: D-Dimer, Quant: 0.58 ug/mL-FEU — ABNORMAL HIGH (ref 0.00–0.50)

## 2022-06-05 MED ORDER — IOHEXOL 350 MG/ML SOLN
100.0000 mL | Freq: Once | INTRAVENOUS | Status: AC | PRN
  Administered 2022-06-05: 100 mL via INTRAVENOUS

## 2022-06-05 MED ORDER — AZITHROMYCIN 250 MG PO TABS: 250.0000 mg | ORAL_TABLET | Freq: Every day | ORAL | 0 refills | Status: DC

## 2022-06-05 MED ORDER — ALUM & MAG HYDROXIDE-SIMETH 200-200-20 MG/5ML PO SUSP
30.0000 mL | Freq: Once | ORAL | Status: AC
  Administered 2022-06-05: 30 mL via ORAL
  Filled 2022-06-05: qty 30

## 2022-06-05 MED ORDER — AMOXICILLIN-POT CLAVULANATE 875-125 MG PO TABS: 1.0000 | ORAL_TABLET | Freq: Two times a day (BID) | ORAL | 0 refills | Status: DC

## 2022-06-05 MED ORDER — LIDOCAINE VISCOUS HCL 2 % MT SOLN
15.0000 mL | Freq: Once | OROMUCOSAL | Status: AC
  Administered 2022-06-05: 15 mL via ORAL
  Filled 2022-06-05: qty 15

## 2022-06-05 NOTE — ED Provider Notes (Signed)
[facility]-EMERGENCY DEPT Provider Note   CSN: [id number] Arrival date & time: 06/05/22  1112     History  Chief Complaint  Patient presents with   Shortness of Breath    Bruce Davies is a 31 y.o. male.   Shortness of Breath    Patient with tobacco use disorder presents to the emergency department due to feeling short of breath.  Started last night, feels like it is difficult to breathe inward.  Associated with pain to the right side of his chest with radiation to his right lower lobes.  The pain feels sharp.  Does endorse cough and viral symptoms starting around Christmas a week ago.  No history of PE or ACS.  He does feel some tightness in his jaw, no history of reflux disease or GERD.  Denies any history of asthma.  Home Medications Prior to Admission medications   Medication Sig Start Date End Date Taking? Authorizing Provider  amoxicillin-clavulanate (AUGMENTIN) 875-125 MG tablet Take 1 tablet by mouth every 12 (twelve) hours. 06/05/22  Yes Theron Arista, PA-C  azithromycin (ZITHROMAX) 250 MG tablet Take 1 tablet (250 mg total) by mouth daily. Take first 2 tablets together, then 1 every day until finished. 06/05/22  Yes Theron Arista, PA-C  ibuprofen (ADVIL) 600 MG tablet Take 1 tablet (600 mg total) by mouth every 6 (six) hours as needed. 12/19/19   Lamptey, Britta Mccreedy, MD  methocarbamol (ROBAXIN) 500 MG tablet Take 1 tablet (500 mg total) by mouth 2 (two) times daily. 11/16/21   Blue, Soijett A, PA-C  ondansetron (ZOFRAN) 4 MG tablet Take 1 tablet (4 mg total) by mouth every 8 (eight) hours as needed for nausea or vomiting. 02/15/21   Horton, Clabe Seal, DO      Allergies    Patient has no known allergies.    Review of Systems   Review of Systems  Respiratory:  Positive for shortness of breath.     Physical Exam Updated Vital Signs BP 134/78   Pulse (!) 57   Temp 98.5 F (36.9 C) (Oral)   Resp 15   SpO2 98%  Physical Exam Vitals and nursing  note reviewed. Exam conducted with a chaperone present.  Constitutional:      Appearance: Normal appearance.  HENT:     Head: Normocephalic and atraumatic.  Eyes:     General: No scleral icterus.       Right eye: No discharge.        Left eye: No discharge.     Extraocular Movements: Extraocular movements intact.     Pupils: Pupils are equal, round, and reactive to light.  Cardiovascular:     Rate and Rhythm: Normal rate and regular rhythm.     Pulses: Normal pulses.     Heart sounds: Normal heart sounds. No murmur heard.    No friction rub. No gallop.  Pulmonary:     Effort: Pulmonary effort is normal. No respiratory distress.     Breath sounds: Examination of the right-lower field reveals decreased breath sounds. Decreased breath sounds present.  Abdominal:     General: Abdomen is flat. Bowel sounds are normal. There is no distension.     Palpations: Abdomen is soft.     Tenderness: There is no abdominal tenderness.  Skin:    General: Skin is warm and dry.     Coloration: Skin is not jaundiced.  Neurological:     Mental Status: He is alert. Mental status is at  baseline.     Coordination: Coordination normal.     ED Results / Procedures / Treatments   Labs (all labs ordered are listed, but only abnormal results are displayed) Labs Reviewed  D-DIMER, QUANTITATIVE - Abnormal; Notable for the following components:      Result Value   D-Dimer, Quant 0.58 (*)    All other components within normal limits  RESP PANEL BY RT-PCR (RSV, FLU A&B, COVID)  RVPGX2  CBC WITH DIFFERENTIAL/PLATELET  BASIC METABOLIC PANEL  BRAIN NATRIURETIC PEPTIDE  TROPONIN I (HIGH SENSITIVITY)  TROPONIN I (HIGH SENSITIVITY)    EKG None  Radiology CT Angio Chest PE W/Cm &/Or Wo Cm  Result Date: 06/05/2022 CLINICAL DATA:  Shortness of breath concern for pulmonary embolus. EXAM: CT ANGIOGRAPHY CHEST WITH CONTRAST TECHNIQUE: Multidetector CT imaging of the chest was performed using the standard  protocol during bolus administration of intravenous contrast. Multiplanar CT image reconstructions and MIPs were obtained to evaluate the vascular anatomy. RADIATION DOSE REDUCTION: This exam was performed according to the departmental dose-optimization program which includes automated exposure control, adjustment of the mA and/or kV according to patient size and/or use of iterative reconstruction technique. CONTRAST:  OMNIPAQUE IOHEXOL 350 MG/ML SOLN COMPARISON:  None Available. FINDINGS: Cardiovascular: Satisfactory opacification of the pulmonary arteries to the segmental level. No evidence of pulmonary embolism. Normal heart size. No pericardial effusion. Mediastinum/Nodes: No enlarged mediastinal, hilar, or axillary lymph nodes. Thyroid gland, trachea, and esophagus demonstrate no significant findings. Lungs/Pleura: Lungs are clear. No pleural effusion or pneumothorax. Upper Abdomen: No acute abnormality. Musculoskeletal: No acute osseous abnormality. Bilateral gynecomastia. Review of the MIP images confirms the above findings. IMPRESSION: No evidence of acute pulmonary embolism or other acute findings in the chest. Electronically Signed   By: Maudry Mayhew M.D.   On: 06/05/2022 14:07   DG Chest 2 View  Result Date: 06/05/2022 CLINICAL DATA:  Shortness of breath cough. EXAM: CHEST - 2 VIEW COMPARISON:  None Available. FINDINGS: Heart size is normal. Bilateral hilar fullness is present. Patchy airspace opacities are present in the infrahilar region on the right. No significant edema is present. Mild blunting of the CP angle noted on the lateral view. Small effusions are not excluded. IMPRESSION: 1. Patchy airspace opacities in the right infrahilar region concerning for early infection. 2. Bilateral hilar fullness. This likely represents combination vascular prominence an reactive nodes. There is some airway thickening. 3. Recommend follow-up two-view chest x-ray after resolution of symptoms.  Electronically Signed   By: Marin Roberts M.D.   On: 06/05/2022 12:34    Procedures Procedures    Medications Ordered in ED Medications  alum & mag hydroxide-simeth (MAALOX/MYLANTA) 200-200-20 MG/5ML suspension 30 mL (30 mLs Oral Given 06/05/22 1215)    And  lidocaine (XYLOCAINE) 2 % viscous mouth solution 15 mL (15 mLs Oral Given 06/05/22 1215)  iohexol (OMNIPAQUE) 350 MG/ML injection 100 mL (100 mLs Intravenous Contrast Given 06/05/22 1351)    ED Course/ Medical Decision Making/ A&P Clinical Course as of 06/05/22 1422  Sun Jun 05, 2022  1303 Resp panel by RT-PCR (RSV, Flu A&B, Covid) Anterior Nasal Swab Negative for COVID and flu [HS]  1304 CBC with Differential No leukocytosis or anemia [HS]  1304 DG Chest 2 View Airway thickening, right lower lobe patchy infiltrate concerning for early pneumonia agree with radiologist [HS]  1316 D-dimer, quantitative(!) Elevated, will proceed with CTA PE study. [HS]  1408 Brain natriuretic peptide Within normal limits [HS]  1408 Troponin I (High Sensitivity)  Within normal limit/low [HS]  1409 Basic metabolic panel No gross electro derangement, AKI [HS]    Clinical Course User Index [HS] Theron Arista, PA-C                           Medical Decision Making Amount and/or Complexity of Data Reviewed Labs: ordered. Decision-making details documented in ED Course. Radiology: ordered. Decision-making details documented in ED Course.  Risk OTC drugs. Prescription drug management.   Patient is to the emergency department due to chest pain shortness of breath.  Differential includes but not limited to ACS, PE, pneumonia, dissection, pneumothorax.  Patient's pain is pleuritic so PE is a consideration although he is not tachycardic or hypoxic.  There is a slightly decreased lung sounds to the right lower lobe, will proceed with chest x-ray, laboratory workup and EKG.  I ordered, viewed and interpreted laboratory workup as documented ED  course.  I ordered, viewed and interpreted imaging studies documented ED course.  Patient on cardiac monitoring in sinus rhythm.  Patient's workup is notable for right lower lobe pneumonia which is actually very focal to where he is having his symptoms.  He is afebrile, not hypoxic and I think he is a great candidate for outpatient oral antibiotic trial.  CTA PE study was negative for PE, doubt this is ACS given negative troponin and no ischemic changes on EKG.    -BP 134/78   Pulse (!) 57   Temp 98.5 F (36.9 C) (Oral)   Resp 15   SpO2 98%   All questions were asked and answered, patient stable for close outpatient follow-up and discharge at this time        Final Clinical Impression(s) / ED Diagnoses Final diagnoses:  Community acquired pneumonia of right lower lobe of lung    Rx / DC Orders ED Discharge Orders          Ordered    azithromycin (ZITHROMAX) 250 MG tablet  Daily        06/05/22 1419    amoxicillin-clavulanate (AUGMENTIN) 875-125 MG tablet  Every 12 hours        06/05/22 1419              Theron Arista, PA-C 06/05/22 1422    Tegeler, Canary Brim, MD 06/05/22 1424

## 2022-06-05 NOTE — Discharge Instructions (Addendum)
You have a right-sided pneumonia, take the antibiotics as prescribed.  Follow-up with your main doctor in 2 weeks for reevaluation and repeat x-ray to ensure resolution of your symptoms.  Return to the ED for new or concerning symptoms.

## 2022-06-05 NOTE — ED Notes (Signed)
Patient transported to CT 

## 2022-08-23 ENCOUNTER — Emergency Department (HOSPITAL_COMMUNITY)
Admission: EM | Admit: 2022-08-23 | Discharge: 2022-08-23 | Disposition: A | Discharge status: Home / Self Care | Payer: BC Managed Care – PPO | Attending: Emergency Medicine | Admitting: Emergency Medicine

## 2022-08-23 ENCOUNTER — Other Ambulatory Visit: Payer: Self-pay

## 2022-08-23 ENCOUNTER — Encounter (HOSPITAL_COMMUNITY): Payer: Self-pay

## 2022-08-23 DIAGNOSIS — L03116 Cellulitis of left lower limb: Secondary | ICD-10-CM | POA: Diagnosis present

## 2022-08-23 DIAGNOSIS — L0291 Cutaneous abscess, unspecified: Secondary | ICD-10-CM

## 2022-08-23 MED ORDER — IBUPROFEN 800 MG PO TABS
800.0000 mg | ORAL_TABLET | Freq: Once | ORAL | Status: AC
  Administered 2022-08-23: 800 mg via ORAL
  Filled 2022-08-23: qty 1

## 2022-08-23 MED ORDER — LIDOCAINE HCL (PF) 1 % IJ SOLN
5.0000 mL | Freq: Once | INTRAMUSCULAR | Status: AC
  Administered 2022-08-23: 5 mL
  Filled 2022-08-23: qty 30

## 2022-08-23 MED ORDER — ACETAMINOPHEN 325 MG PO TABS
650.0000 mg | ORAL_TABLET | Freq: Once | ORAL | Status: AC
  Administered 2022-08-23: 650 mg via ORAL
  Filled 2022-08-23: qty 2

## 2022-08-23 NOTE — ED Provider Notes (Signed)
[place] EMERGENCY DEPARTMENT AT [facility] Provider Note   CSN: [id number] Arrival date & time: 08/23/22  [id number]     History  Chief Complaint  Patient presents with   Abscess    Bruce Davies is a 32 y.o. male.  Patient is a 32 year old male with no significant past medical history presenting to the emergency department with concern for abscess to his leg.  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  He states that the pain got worse throughout the day and anytime it would rub while sitting or rub against his other leg.  He states that the pain got worse and the swelling was worse this morning which prompted him to come to the emergency department.  He states that he has had boils in the past that have improved with warm compresses however he states that this feels bigger and more painful than usual.  He has not seen any drainage.  He denies any fevers.   Abscess      Home Medications Prior to Admission medications   Medication Sig Start Date End Date Taking? Authorizing Provider  amoxicillin-clavulanate (AUGMENTIN) 875-125 MG tablet Take 1 tablet by mouth every 12 (twelve) hours. 06/05/22   Sherrill Raring, PA-C  azithromycin (ZITHROMAX) 250 MG tablet Take 1 tablet (250 mg total) by mouth daily. Take first 2 tablets together, then 1 every day until finished. 06/05/22   Sherrill Raring, PA-C  ibuprofen (ADVIL) 600 MG tablet Take 1 tablet (600 mg total) by mouth every 6 (six) hours as needed. 12/19/19   Lamptey, Myrene Galas, MD  methocarbamol (ROBAXIN) 500 MG tablet Take 1 tablet (500 mg total) by mouth 2 (two) times daily. 11/16/21   Blue, Soijett A, PA-C  ondansetron (ZOFRAN) 4 MG tablet Take 1 tablet (4 mg total) by mouth every 8 (eight) hours as needed for nausea or vomiting. 02/15/21   Horton, Alvin Critchley, DO      Allergies    Patient has no known allergies.    Review of Systems   Review of  Systems  Physical Exam Updated Vital Signs BP (!) 152/109   Pulse (!) 51   Temp 98.7 F (37.1 C) (Oral)   Resp 18   Wt 124 kg   SpO2 96%   BMI 37.08 kg/m  Physical Exam Vitals and nursing note reviewed.  Constitutional:      General: He is not in acute distress.    Appearance: Normal appearance. He is obese.  HENT:     Head: Normocephalic.     Nose: Nose normal.  Eyes:     Extraocular Movements: Extraocular movements intact.  Pulmonary:     Effort: Pulmonary effort is normal.  Abdominal:     General: Abdomen is flat.  Musculoskeletal:        General: Normal range of motion.     Cervical back: Normal range of motion.  Skin:    General: Skin is warm and dry.     Comments: ~1x2 cm area of fluctance of R medial thigh, no surrounding erythema or wamrth  Neurological:     General: No focal deficit present.     Mental Status: He is alert and oriented to person, place, and time.  Psychiatric:        Mood and Affect: Mood normal.        Behavior: Behavior normal.     ED  Results / Procedures / Treatments   Labs (all labs ordered are listed, but only abnormal results are displayed) Labs Reviewed - No data to display  EKG None  Radiology No results found.  Procedures Ultrasound ED Soft Tissue  Date/Time: 08/23/2022 9:14 AM  Performed by: Kemper Durie, DO Authorized by: Kemper Durie, DO   Procedure details:    Indications: localization of abscess     Transverse view:  Visualized   Longitudinal view:  Visualized   Images: archived   Location:    Location: lower extremity     Side:  Right Findings:     abscess present .Marland KitchenIncision and Drainage  Date/Time: 08/23/2022 10:02 AM  Performed by: Kemper Durie, DO Authorized by: Kemper Durie, DO   Consent:    Consent obtained:  Verbal   Consent given by:  Patient   Risks discussed:  Bleeding, incomplete drainage and infection   Alternatives discussed:  No treatment, delayed  treatment and alternative treatment Universal protocol:    Procedure explained and questions answered to patient or proxy's satisfaction: yes     Patient identity confirmed:  Verbally with patient Location:    Type:  Abscess   Size:  1x1.5 cm   Location:  Lower extremity   Lower extremity location:  Leg   Leg location:  R upper leg Pre-procedure details:    Skin preparation:  Chlorhexidine with alcohol Sedation:    Sedation type:  None Anesthesia:    Anesthesia method:  Local infiltration   Local anesthetic:  Lidocaine 1% w/o epi Procedure type:    Complexity:  Simple Procedure details:    Ultrasound guidance: yes     Needle aspiration: no     Incision types:  Stab incision   Incision depth:  Dermal   Wound management:  Probed and deloculated   Drainage:  Purulent   Drainage amount:  Moderate   Wound treatment:  Wound left open   Packing materials:  None Post-procedure details:    Procedure completion:  Tolerated well, no immediate complications     Medications Ordered in ED Medications  lidocaine (PF) (XYLOCAINE) 1 % injection 5 mL (has no administration in time range)  acetaminophen (TYLENOL) tablet 650 mg (650 mg Oral Given 08/23/22 0925)  ibuprofen (ADVIL) tablet 800 mg (800 mg Oral Given 08/23/22 [id number])    ED Course/ Medical Decision Making/ A&P                             Medical Decision Making This patient presents to the ED with chief complaint(s) of possible abscess with pertinent past medical history of prior abscesses which further complicates the presenting complaint. The complaint involves an extensive differential diagnosis and also carries with it a high risk of complications and morbidity.    The differential diagnosis includes abscess, phlegmon, no evidence of cellulitis, cyst, lymphadenopathy   Additional history obtained: Additional history obtained from N/A Records reviewed N/A  ED Course and Reassessment: Patient on arrival is well-appearing  in no acute distress.  He does have an exquisitely tender area of swelling and fluctuance in his right medial thigh.  Ultrasound will be performed to evaluate for fluid collection for possible I&D.  The patient was given Tylenol and Motrin for pain control.  He has no surrounding erythema or warmth making cellulitis unlikely.  Independent labs interpretation:  N/A  Independent visualization of imaging: N/A  Consultation: - Consulted or discussed management/test  interpretation w/ external professional: N/A  Consideration for admission or further workup: Patient has no emergent conditions requiring admission or further work-up at this time and is stable for discharge home with primary care follow-up  Social Determinants of health: N/A    Risk OTC drugs. Prescription drug management.          Final Clinical Impression(s) / ED Diagnoses Final diagnoses:  Abscess    Rx / DC Orders ED Discharge Orders     None         Kemper Durie, DO 08/23/22 1007

## 2022-08-23 NOTE — ED Triage Notes (Signed)
C/o abscess to right leg x2 days with redness and swelling.  Denies drainage.

## 2022-08-23 NOTE — Discharge Instructions (Signed)
You were seen in the emergency department for your abscess. We were able to drain it for you in the emergency department and it was left open and may continue to drain. You should continue to apply warm compresses for 20 minutes at time several times a day for the next few days to help encourage drainage. You should follow up with your primary doctor in the next few days to have the wound rechecked. You should return to the emergency department if you have redness streaking up from your wound, you have fevers or if you have any other new or concerning symptoms.

## 2023-02-18 IMAGING — DX DG HAND COMPLETE 3+V*R*
3 series · 3 of 3 positions shown · non-contrast
Comparison: None Available.

CLINICAL DATA: Right hand pain

EXAM:
RIGHT HAND - COMPLETE 3+ VIEW

[hand ap]
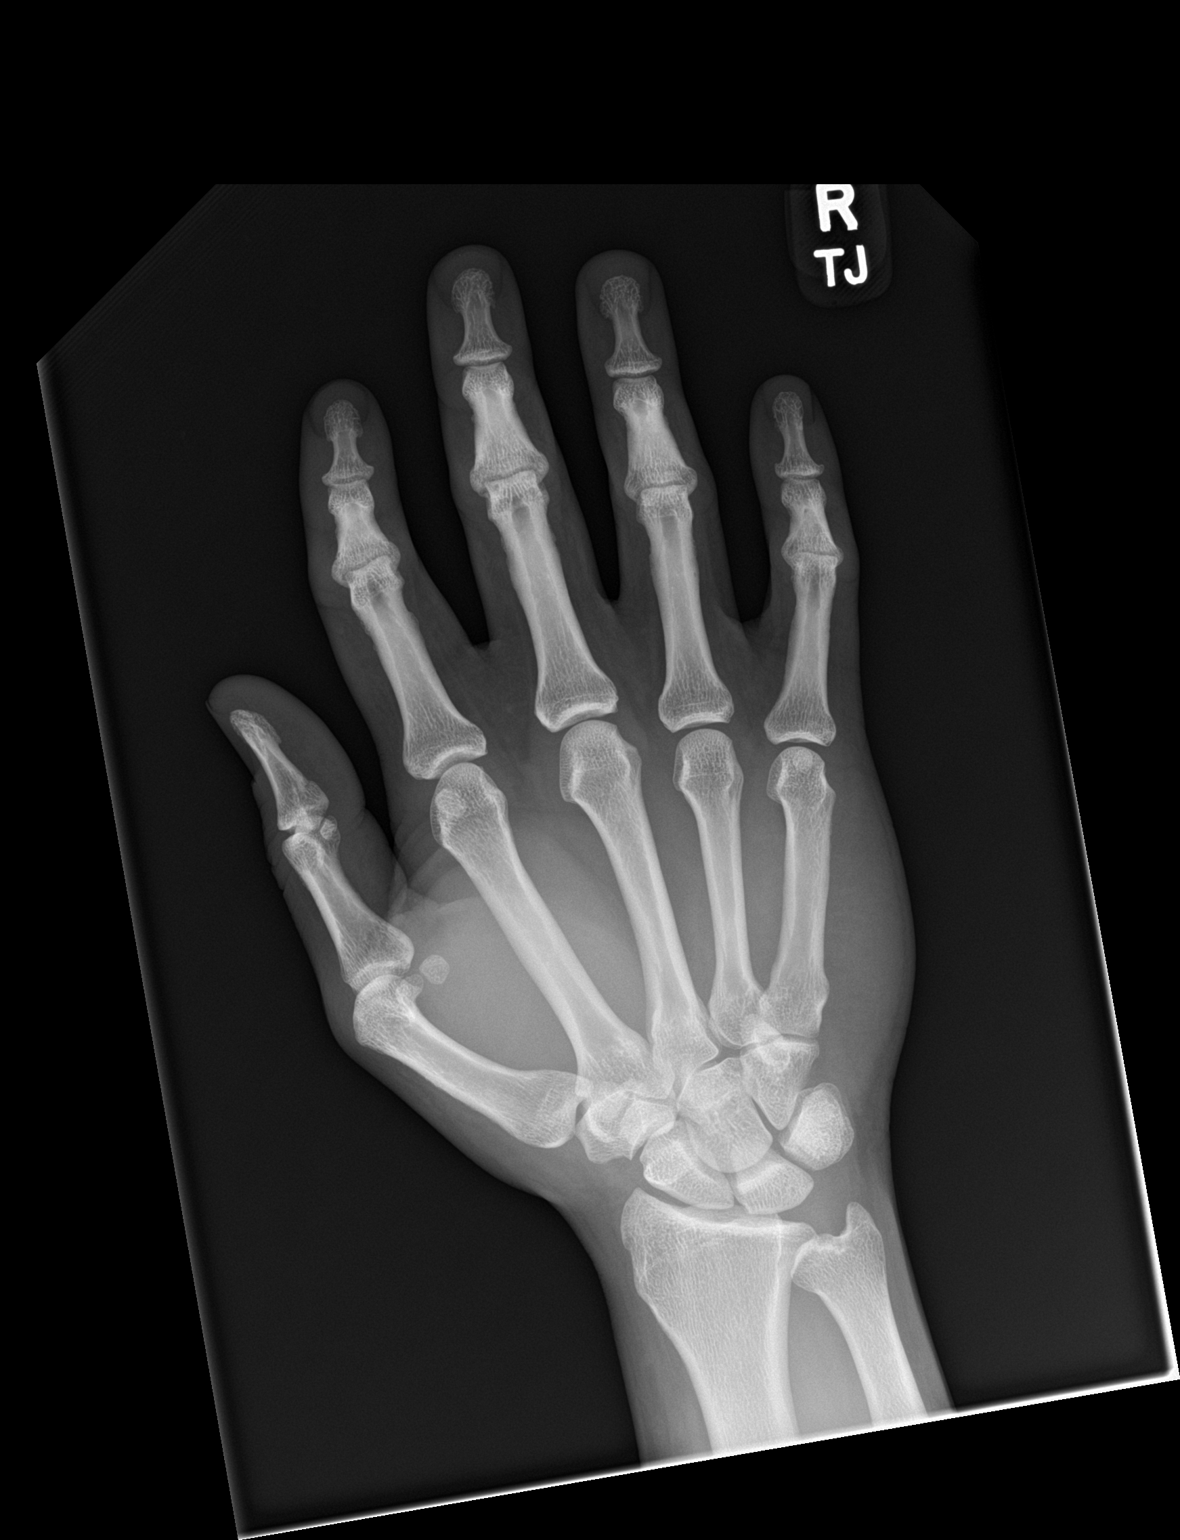

[hand obl]
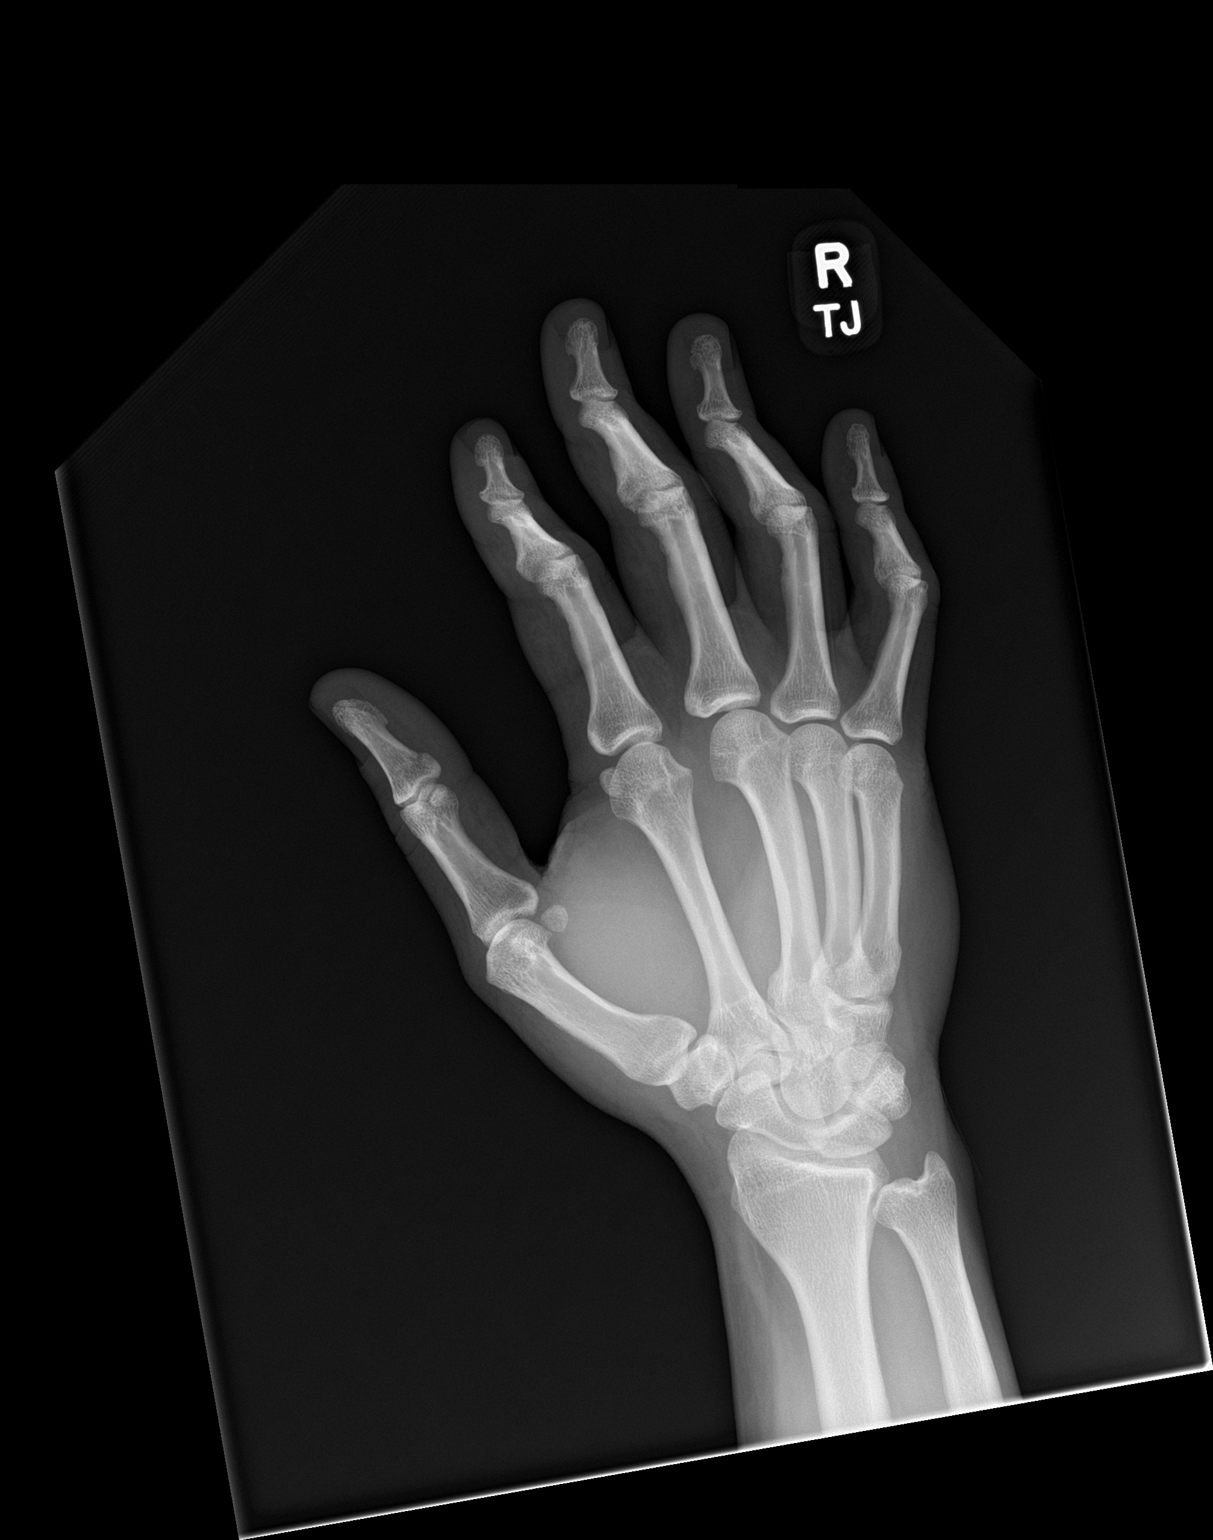

[hand lat]
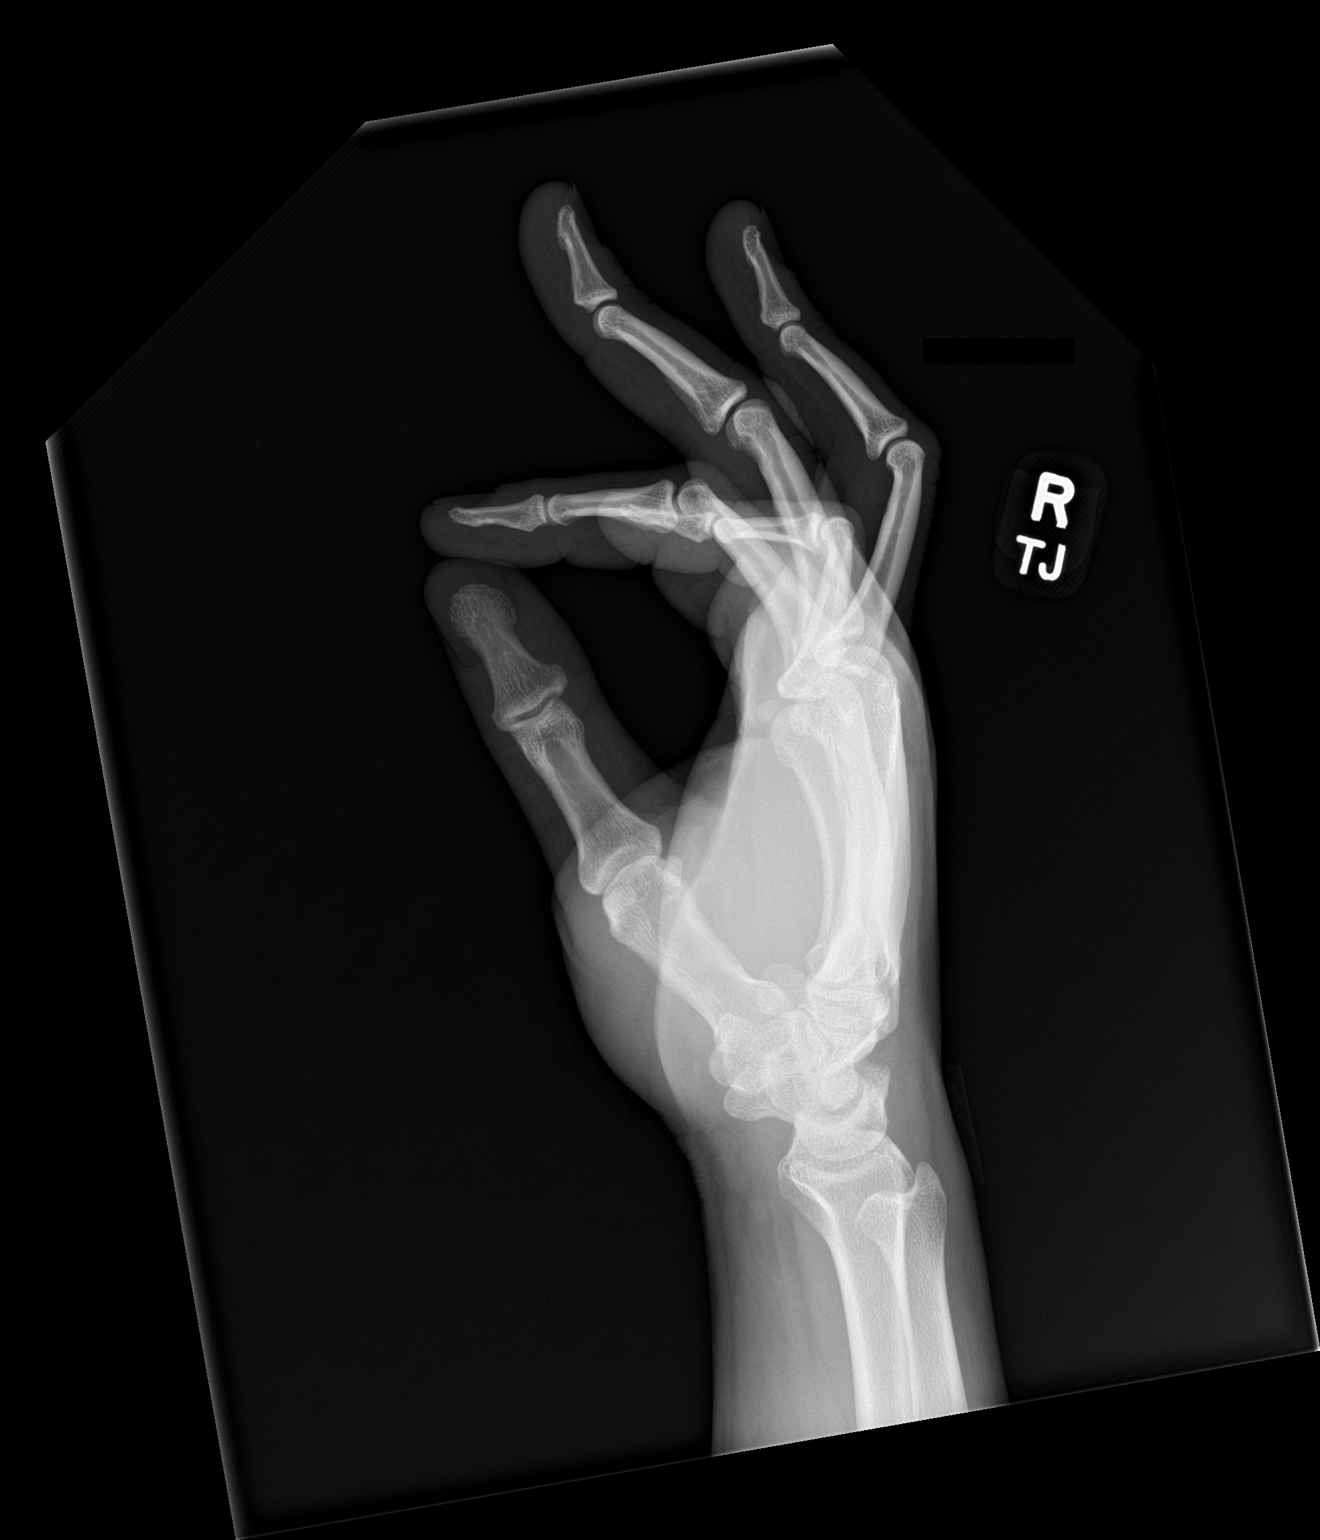

[3 of 3 positions shown; findings below may reference images not displayed]

FINDINGS: There is no evidence of fracture or dislocation. There is no
evidence of arthropathy or other focal bone abnormality. Soft
tissues are unremarkable.
IMPRESSION: Negative.

## 2023-06-19 ENCOUNTER — Ambulatory Visit (INDEPENDENT_AMBULATORY_CARE_PROVIDER_SITE_OTHER): Payer: BC Managed Care – PPO | Admitting: Podiatry

## 2023-06-19 ENCOUNTER — Encounter: Payer: Self-pay | Admitting: Podiatry

## 2023-06-19 DIAGNOSIS — B353 Tinea pedis: Secondary | ICD-10-CM

## 2023-06-19 DIAGNOSIS — B351 Tinea unguium: Secondary | ICD-10-CM

## 2023-06-19 DIAGNOSIS — M79675 Pain in left toe(s): Secondary | ICD-10-CM | POA: Diagnosis not present

## 2023-06-19 DIAGNOSIS — M79674 Pain in right toe(s): Secondary | ICD-10-CM

## 2023-06-19 MED ORDER — CLOTRIMAZOLE-BETAMETHASONE 1-0.05 % EX CREA: 1.0000 | TOPICAL_CREAM | Freq: Every day | CUTANEOUS | 2 refills | Status: AC

## 2023-06-19 MED ORDER — TERBINAFINE HCL 250 MG PO TABS: 250.0000 mg | ORAL_TABLET | Freq: Every day | ORAL | 0 refills | Status: AC

## 2023-06-19 NOTE — Progress Notes (Signed)
   Chief Complaint  Patient presents with   Nail Problem    Toenails bilateral - thick, discolored nails x few years, noticed that they curving, but not sore, in between the toes are itching, peeling-no treatment   New Patient (Initial Visit)    Subjective: 33 y.o. male presenting today as a new patient for evaluation of discoloration with thickening to the toenails bilateral.  Onset a few years ago.  He also gets itching with occasional peeling in between the toes consistent with tinea pedis.  Patient works management consultant  No past medical history on file.  Past Surgical History:  Procedure Laterality Date   arm surgery      No Known Allergies  Objective: Physical Exam General: The patient is alert and oriented x3 in no acute distress.  Dermatology: Hyperkeratotic, discolored, thickened, onychodystrophy noted. Skin is warm, dry and supple bilateral lower extremities. Negative for open lesions or macerations.  Maceration of skin also noted to the interdigital areas  Vascular: Palpable pedal pulses bilaterally. No edema or erythema noted. Capillary refill within normal limits.  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 #2  Today we discussed different treatment options including oral, topical, and laser antifungal treatment modalities.  We discussed their efficacies and side effects.  Patient opts for oral antifungal treatment modality #3 prescription for Lamisil  250 mg #90 daily. Pt denies a history of liver pathology or symptoms.  Patient is otherwise healthy.  CMP 04/17/2023 hepatic function WNL #4  Mechanical debridement of nails 1-5 bilateral was performed using a nail nipper without incident or bleeding  #5 prescription for Lotrisone  cream  apply daily  #6 return to clinic 6 months   Thresa EMERSON Sar, DPM [facility]  Dr. Thresa EMERSON Sar, DPM    [address] [place], [place] [address]                Office [phone]  Fax [phone]
